# Patient Record
Sex: Female | Born: 1985 | Race: Black or African American | Hispanic: No | Marital: Single | Smoking: Never smoker
Health system: Southern US, Community
[De-identification: ages and names within clinical notes are randomized; demographics above are authoritative.]

## PROBLEM LIST (undated history)

## (undated) DIAGNOSIS — E0590 Thyrotoxicosis, unspecified without thyrotoxic crisis or storm: Secondary | ICD-10-CM

## (undated) DIAGNOSIS — E282 Polycystic ovarian syndrome: Secondary | ICD-10-CM

## (undated) DIAGNOSIS — E119 Type 2 diabetes mellitus without complications: Secondary | ICD-10-CM

---
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
## 2003-10-07 ENCOUNTER — Emergency Department (HOSPITAL_COMMUNITY): Admission: EM | Admit: 2003-10-07 | Discharge: 2003-10-08 | Payer: Self-pay | Admitting: Emergency Medicine

## 2004-03-12 ENCOUNTER — Emergency Department (HOSPITAL_COMMUNITY): Admission: EM | Admit: 2004-03-12 | Discharge: 2004-03-12 | Payer: Self-pay | Admitting: Emergency Medicine

## 2004-03-13 ENCOUNTER — Emergency Department (HOSPITAL_COMMUNITY): Admission: EM | Admit: 2004-03-13 | Discharge: 2004-03-13 | Payer: Self-pay | Admitting: Emergency Medicine

## 2004-07-09 ENCOUNTER — Other Ambulatory Visit: Admission: RE | Admit: 2004-07-09 | Discharge: 2004-07-09 | Payer: Self-pay | Admitting: Obstetrics and Gynecology

## 2004-10-04 ENCOUNTER — Emergency Department (HOSPITAL_COMMUNITY): Admission: EM | Admit: 2004-10-04 | Discharge: 2004-10-05 | Payer: Self-pay | Admitting: Emergency Medicine

## 2005-06-24 ENCOUNTER — Emergency Department (HOSPITAL_COMMUNITY): Admission: EM | Admit: 2005-06-24 | Discharge: 2005-06-24 | Payer: Self-pay | Admitting: Emergency Medicine

## 2005-11-23 ENCOUNTER — Emergency Department (HOSPITAL_COMMUNITY): Admission: EM | Admit: 2005-11-23 | Discharge: 2005-11-23 | Payer: Self-pay | Admitting: Emergency Medicine

## 2006-05-27 ENCOUNTER — Emergency Department (HOSPITAL_COMMUNITY): Admission: EM | Admit: 2006-05-27 | Discharge: 2006-05-27 | Payer: Self-pay | Admitting: Emergency Medicine

## 2007-05-17 ENCOUNTER — Emergency Department (HOSPITAL_COMMUNITY): Admission: EM | Admit: 2007-05-17 | Discharge: 2007-05-17 | Payer: Self-pay | Admitting: Emergency Medicine

## 2008-03-22 ENCOUNTER — Emergency Department (HOSPITAL_COMMUNITY): Admission: EM | Admit: 2008-03-22 | Discharge: 2008-03-22 | Payer: Self-pay | Admitting: Emergency Medicine

## 2009-01-23 ENCOUNTER — Emergency Department (HOSPITAL_COMMUNITY): Admission: EM | Admit: 2009-01-23 | Discharge: 2009-01-23 | Payer: Self-pay | Admitting: Emergency Medicine

## 2009-05-09 IMAGING — CT CT NECK W/ CM
3 of 4 series · 16 of 33 positions shown, 19 images · IV contrast (APPLIED)
Comparison: None

CLINICAL DATA: Neck pain.  Cervical adenopathy

CT NECK WITH CONTRAST
TECHNIQUE: Multidetector CT imaging of the neck was performed with
intravenous contrast.
Contrast: 100 ml 5mnipaque-4II IV

[Series 2: st neck 2.0 b31s · axial · 0.43mm/px · z∈[-372,-212]mm · 8 of 101 slices shown, 10 images]
[im 11/101  soft-tissue]
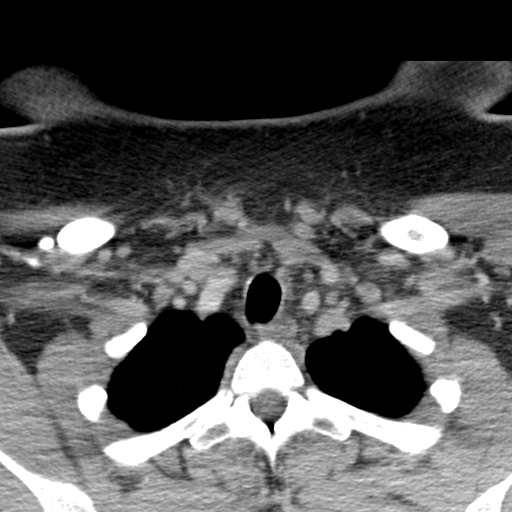
[im 11/101  bone]
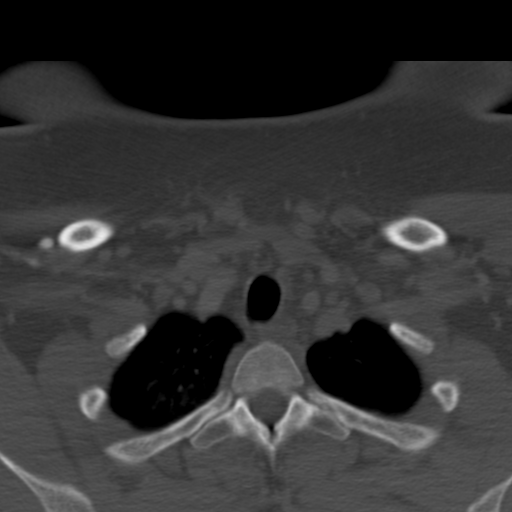
[im 21/101  bone]
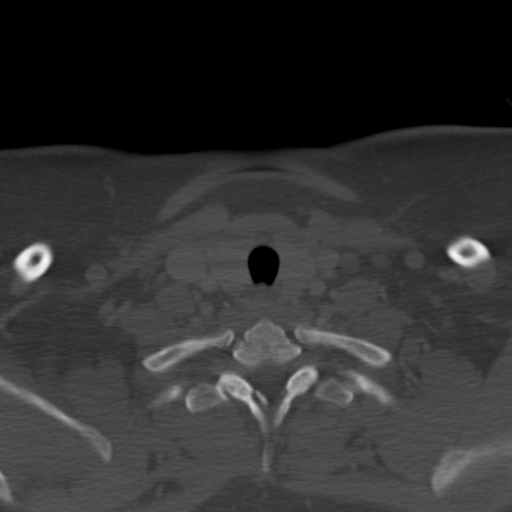
[im 31/101  bone]
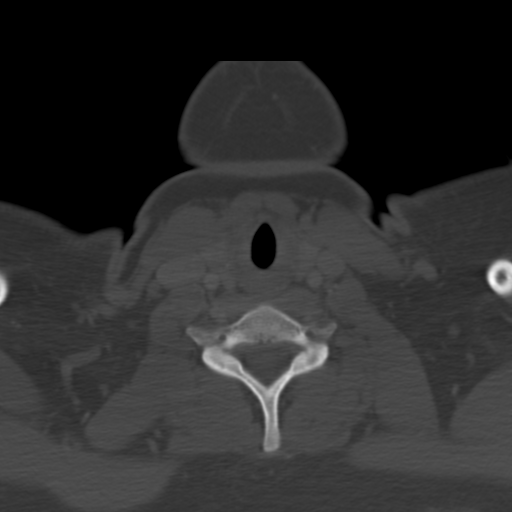
[im 41/101  bone]
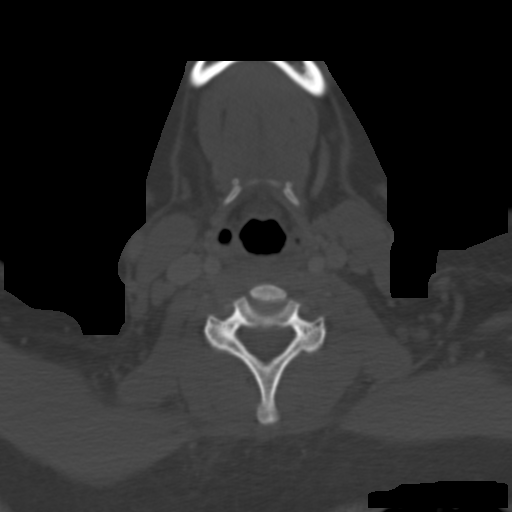
[im 61/101  soft-tissue]
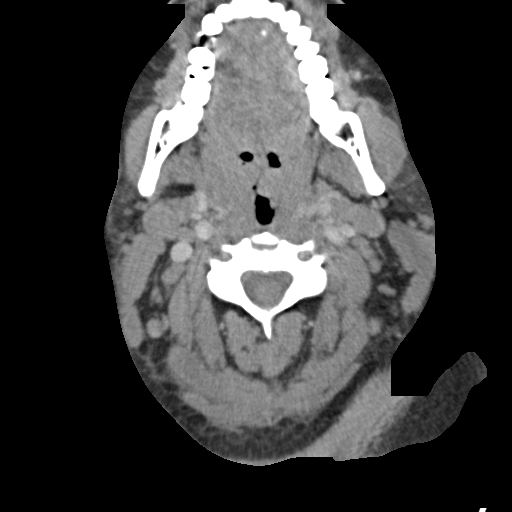
[im 61/101  bone]
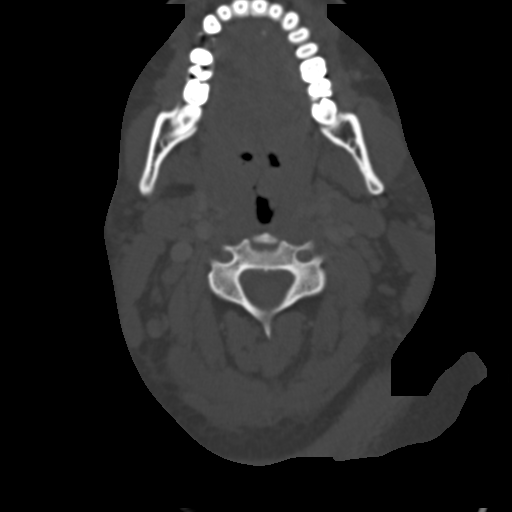
[im 71/101  bone]
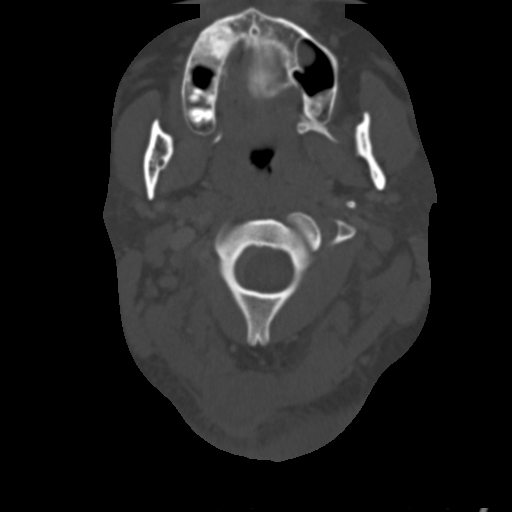
[im 81/101  bone]
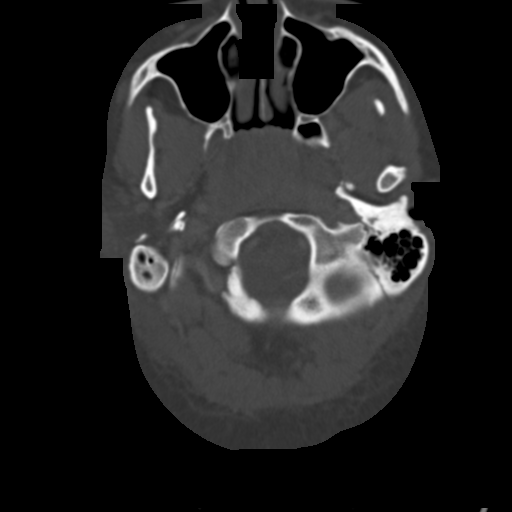
[im 91/101  bone]
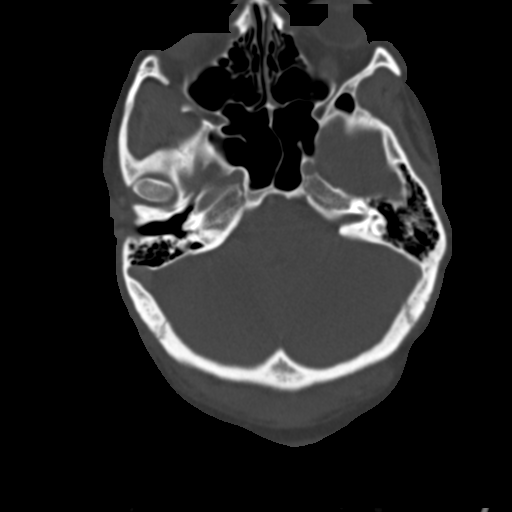

[Series 602: coronal neck · coronal · 0.43mm/px · 3 of 63 slices shown]
[im 13/63  bone]
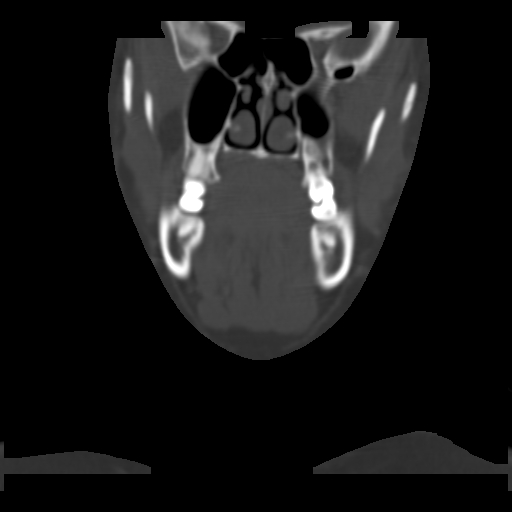
[im 25/63  bone]
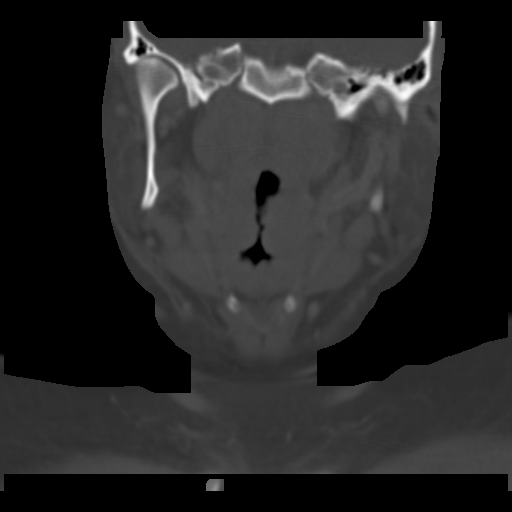
[im 38/63  bone]
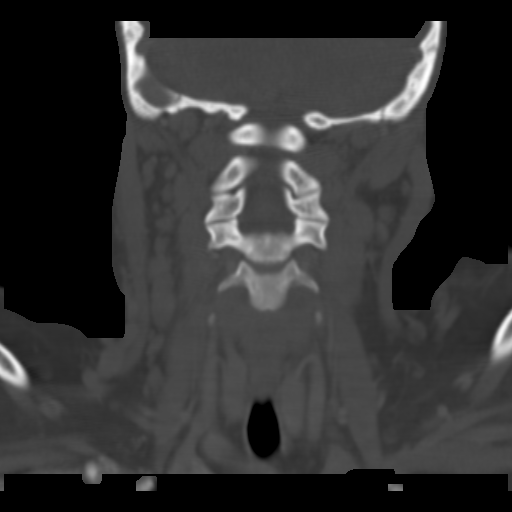

[Series 603: sagittal neck · sagittal · 0.43mm/px · 5 of 59 slices shown, 6 images]
[im 20/59  bone]
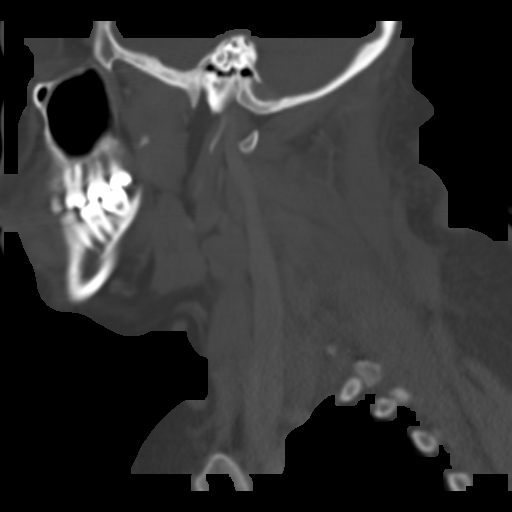
[im 25/59  bone]
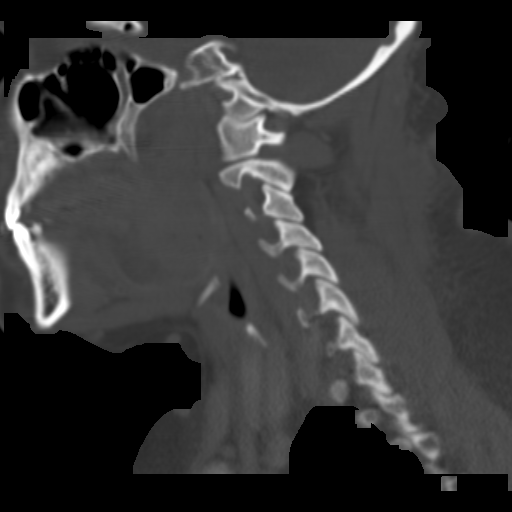
[im 30/59  soft-tissue]
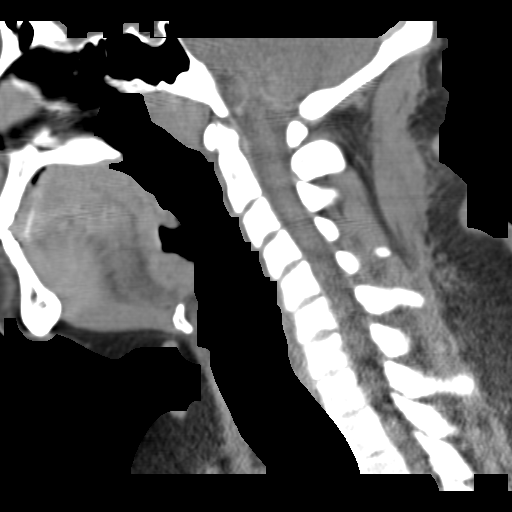
[im 30/59  bone]
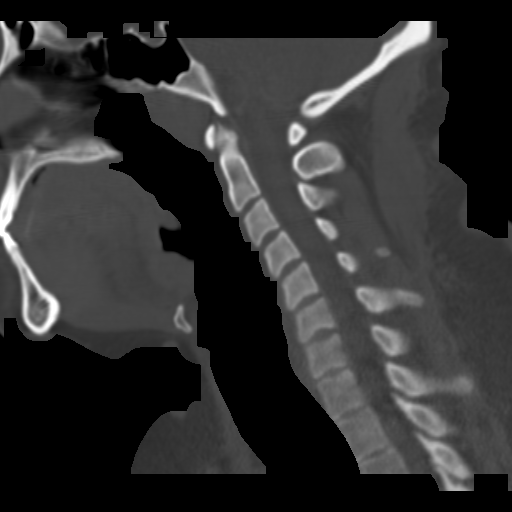
[im 34/59  bone]
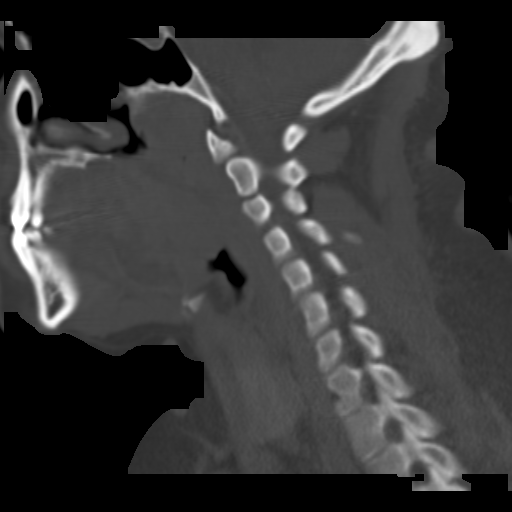
[im 39/59  bone]
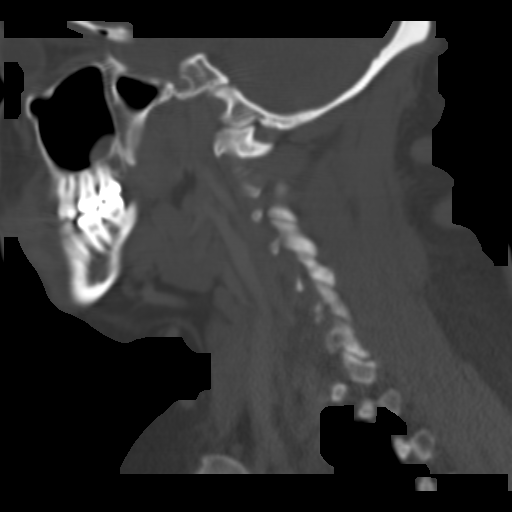

[16 of 33 positions shown; findings below may reference images not displayed]

FINDINGS: There is bilateral cervical lymphadenopathy.  The largest
nodes are level II nodes bilaterally, measuring 18 mm on the right
and 13 mm on the left.  Scattered sub centimeter level three nodes
are noted.  There are sub centimeter level five nodes posteriorly
bilaterally.  The nodes are all homogeneous and

 there is no necrosis of the nodes.

There is hypertrophy of the pharyngeal lymphoid tissue involving
the posterior nasopharynx and the tonsils.  This is symmetric and
homogeneous.  This may be due to hypertrophy due to infection or
possibly lymphoma.

The thyroid gland is normal.  The larynx appears normal.  The lung
apices are clear.  Mild mucosal thickening is present in the left
maxillary sinus the remainder of the sinuses are clear.
IMPRESSION: Mild to moderate cervical adenopathy bilaterally.  There is a
moderate amount of pharyngeal lymphoid  hypertrophy.  The findings
are most suggestive of reactive adenopathy related to pharyngitis.
Lymphoma is also a possibility.  Close clinical followup to
resolution of the adenopathy is suggested and biopsy may be
appropriate if the nodes do not resolve.

## 2009-12-05 ENCOUNTER — Emergency Department (HOSPITAL_COMMUNITY): Admission: EM | Admit: 2009-12-05 | Discharge: 2009-12-06 | Payer: Self-pay | Admitting: Emergency Medicine

## 2010-10-30 ENCOUNTER — Emergency Department (HOSPITAL_COMMUNITY): Admission: EM | Admit: 2010-10-30 | Discharge: 2010-08-11 | Payer: Self-pay | Admitting: Emergency Medicine

## 2011-02-08 LAB — URINALYSIS, ROUTINE W REFLEX MICROSCOPIC
Glucose, UA: NEGATIVE mg/dL
pH: 6 (ref 5.0–8.0)

## 2011-02-08 LAB — DIFFERENTIAL
Basophils Absolute: 0 10*3/uL (ref 0.0–0.1)
Basophils Relative: 1 % (ref 0–1)
Eosinophils Absolute: 0.1 10*3/uL (ref 0.0–0.7)
Eosinophils Relative: 2 % (ref 0–5)
Lymphocytes Relative: 34 % (ref 12–46)
Lymphs Abs: 1.2 10*3/uL (ref 0.7–4.0)
Monocytes Absolute: 0.6 10*3/uL (ref 0.1–1.0)
Monocytes Relative: 16 % — ABNORMAL HIGH (ref 3–12)
Neutro Abs: 1.7 10*3/uL (ref 1.7–7.7)
Neutrophils Relative %: 47 % (ref 43–77)

## 2011-02-08 LAB — COMPREHENSIVE METABOLIC PANEL
ALT: 21 U/L (ref 0–35)
AST: 26 U/L (ref 0–37)
Albumin: 4.1 g/dL (ref 3.5–5.2)
Alkaline Phosphatase: 89 U/L (ref 39–117)
BUN: 9 mg/dL (ref 6–23)
CO2: 27 mEq/L (ref 19–32)
Calcium: 9.2 mg/dL (ref 8.4–10.5)
Chloride: 104 mEq/L (ref 96–112)
Creatinine, Ser: 0.72 mg/dL (ref 0.4–1.2)
GFR calc Af Amer: >60 mL/min (ref >60)
GFR calc non Af Amer: >60 mL/min (ref >60)
Glucose, Bld: 112 mg/dL — ABNORMAL HIGH (ref 70–99)
Potassium: 4.4 mEq/L (ref 3.5–5.1)
Sodium: 138 mEq/L (ref 135–145)
Total Bilirubin: 0.7 mg/dL (ref 0.3–1.2)
Total Protein: 8.2 g/dL (ref 6.0–8.3)

## 2011-02-08 LAB — CBC
HCT: 38.2 % (ref 36.0–46.0)
Hemoglobin: 13.2 g/dL (ref 12.0–15.0)
MCHC: 34.6 g/dL (ref 30.0–36.0)
MCV: 85.2 fL (ref 78.0–100.0)
Platelets: 293 10*3/uL (ref 150–400)
RBC: 4.48 MIL/uL (ref 3.87–5.11)
RDW: 13.6 % (ref 11.5–15.5)
WBC: 3.6 10*3/uL — ABNORMAL LOW (ref 4.0–10.5)

## 2011-02-08 LAB — PREGNANCY, URINE: Preg Test, Ur: NEGATIVE

## 2011-03-05 LAB — DIFFERENTIAL
Basophils Relative: 2 % — ABNORMAL HIGH (ref 0–1)
Lymphocytes Relative: 35 % (ref 12–46)
Monocytes Relative: 8 % (ref 3–12)
Neutro Abs: 3 10*3/uL (ref 1.7–7.7)

## 2011-03-05 LAB — CBC
Hemoglobin: 12.8 g/dL (ref 12.0–15.0)
RBC: 4.47 MIL/uL (ref 3.87–5.11)
WBC: 5.8 10*3/uL (ref 4.0–10.5)

## 2011-04-06 ENCOUNTER — Emergency Department (HOSPITAL_COMMUNITY)
Admission: EM | Admit: 2011-04-06 | Discharge: 2011-04-06 | Disposition: A | Discharge status: Home / Self Care | Payer: Self-pay | Attending: Emergency Medicine | Admitting: Emergency Medicine

## 2011-04-06 DIAGNOSIS — N12 Tubulo-interstitial nephritis, not specified as acute or chronic: Secondary | ICD-10-CM | POA: Insufficient documentation

## 2011-04-06 DIAGNOSIS — R110 Nausea: Secondary | ICD-10-CM | POA: Insufficient documentation

## 2011-04-06 DIAGNOSIS — R10816 Epigastric abdominal tenderness: Secondary | ICD-10-CM | POA: Insufficient documentation

## 2011-04-06 DIAGNOSIS — R109 Unspecified abdominal pain: Secondary | ICD-10-CM | POA: Insufficient documentation

## 2011-04-06 LAB — POCT PREGNANCY, URINE: Preg Test, Ur: NEGATIVE

## 2011-04-06 LAB — URINALYSIS, ROUTINE W REFLEX MICROSCOPIC
Ketones, ur: NEGATIVE mg/dL
Nitrite: NEGATIVE
Specific Gravity, Urine: 1.022 (ref 1.005–1.030)
pH: 6 (ref 5.0–8.0)

## 2011-04-06 LAB — URINE MICROSCOPIC-ADD ON

## 2011-04-07 LAB — URINE CULTURE: Colony Count: 40000

## 2011-07-30 ENCOUNTER — Emergency Department (HOSPITAL_COMMUNITY): Payer: BC Managed Care – PPO

## 2011-07-30 ENCOUNTER — Emergency Department (HOSPITAL_COMMUNITY)
Admission: EM | Admit: 2011-07-30 | Discharge: 2011-07-30 | Disposition: A | Discharge status: Home / Self Care | Payer: BC Managed Care – PPO | Attending: Emergency Medicine | Admitting: Emergency Medicine

## 2011-07-30 DIAGNOSIS — R110 Nausea: Secondary | ICD-10-CM | POA: Insufficient documentation

## 2011-07-30 DIAGNOSIS — R109 Unspecified abdominal pain: Secondary | ICD-10-CM | POA: Insufficient documentation

## 2011-07-30 LAB — COMPREHENSIVE METABOLIC PANEL
BUN: 10 mg/dL (ref 6–23)
CO2: 24 mEq/L (ref 19–32)
Calcium: 8.8 mg/dL (ref 8.4–10.5)
GFR calc Af Amer: >60 mL/min (ref >60)
GFR calc non Af Amer: >60 mL/min (ref >60)
Glucose, Bld: 108 mg/dL — ABNORMAL HIGH (ref 70–99)
Total Protein: 7.6 g/dL (ref 6.0–8.3)

## 2011-07-30 LAB — CBC
HCT: 36.1 % (ref 36.0–46.0)
Hemoglobin: 12.1 g/dL (ref 12.0–15.0)
MCH: 28.7 pg (ref 26.0–34.0)
MCHC: 33.5 g/dL (ref 30.0–36.0)
MCV: 85.5 fL (ref 78.0–100.0)
RBC: 4.22 MIL/uL (ref 3.87–5.11)

## 2011-07-30 LAB — URINALYSIS, ROUTINE W REFLEX MICROSCOPIC
Bilirubin Urine: NEGATIVE
Ketones, ur: NEGATIVE mg/dL
Protein, ur: NEGATIVE mg/dL
Urobilinogen, UA: 0.2 mg/dL (ref 0.0–1.0)

## 2011-07-30 LAB — DIFFERENTIAL
Lymphocytes Relative: 17 % (ref 12–46)
Lymphs Abs: 1.6 10*3/uL (ref 0.7–4.0)
Monocytes Absolute: 0.6 10*3/uL (ref 0.1–1.0)
Monocytes Relative: 6 % (ref 3–12)
Neutro Abs: 7.2 10*3/uL (ref 1.7–7.7)

## 2011-07-30 LAB — LIPASE, BLOOD: Lipase: 35 U/L (ref 11–59)

## 2011-07-30 LAB — URINE MICROSCOPIC-ADD ON

## 2011-08-18 LAB — DIFFERENTIAL
Lymphocytes Relative: 36
Lymphs Abs: 2.8
Monocytes Absolute: 0.5
Monocytes Relative: 7
Neutro Abs: 4.4
Neutrophils Relative %: 55

## 2011-08-18 LAB — HEPATIC FUNCTION PANEL
ALT: 15
AST: 16
Bilirubin, Direct: <0.1
Total Bilirubin: 0.3

## 2011-08-18 LAB — URINALYSIS, ROUTINE W REFLEX MICROSCOPIC
Bilirubin Urine: NEGATIVE
Hgb urine dipstick: NEGATIVE
Nitrite: NEGATIVE
Protein, ur: NEGATIVE
Specific Gravity, Urine: 1.022
Urobilinogen, UA: 1

## 2011-08-18 LAB — URINE MICROSCOPIC-ADD ON

## 2011-08-18 LAB — CBC
Hemoglobin: 11.8 — ABNORMAL LOW
MCHC: 35.6
RBC: 3.91
WBC: 8

## 2011-08-18 LAB — POCT PREGNANCY, URINE: Preg Test, Ur: NEGATIVE

## 2011-08-18 LAB — POCT I-STAT, CHEM 8
BUN: 7
Calcium, Ion: 1.17
Chloride: 103
Creatinine, Ser: 0.9
Glucose, Bld: 99
HCT: 36
Potassium: 3.8

## 2019-10-21 ENCOUNTER — Encounter (HOSPITAL_COMMUNITY): Payer: Self-pay | Admitting: *Deleted

## 2019-10-21 ENCOUNTER — Emergency Department (HOSPITAL_COMMUNITY): Payer: No Typology Code available for payment source

## 2019-10-21 ENCOUNTER — Other Ambulatory Visit: Payer: Self-pay

## 2019-10-21 ENCOUNTER — Emergency Department (HOSPITAL_COMMUNITY)
Admission: EM | Admit: 2019-10-21 | Discharge: 2019-10-21 | Disposition: A | Discharge status: Home / Self Care | Payer: No Typology Code available for payment source | Attending: Emergency Medicine | Admitting: Emergency Medicine

## 2019-10-21 DIAGNOSIS — Z3A00 Weeks of gestation of pregnancy not specified: Secondary | ICD-10-CM | POA: Insufficient documentation

## 2019-10-21 DIAGNOSIS — O99891 Other specified diseases and conditions complicating pregnancy: Secondary | ICD-10-CM | POA: Insufficient documentation

## 2019-10-21 DIAGNOSIS — Y92410 Unspecified street and highway as the place of occurrence of the external cause: Secondary | ICD-10-CM | POA: Insufficient documentation

## 2019-10-21 DIAGNOSIS — Y999 Unspecified external cause status: Secondary | ICD-10-CM | POA: Diagnosis not present

## 2019-10-21 DIAGNOSIS — M79662 Pain in left lower leg: Secondary | ICD-10-CM | POA: Diagnosis not present

## 2019-10-21 DIAGNOSIS — Y93I9 Activity, other involving external motion: Secondary | ICD-10-CM | POA: Insufficient documentation

## 2019-10-21 DIAGNOSIS — M79605 Pain in left leg: Secondary | ICD-10-CM

## 2019-10-21 HISTORY — DX: Polycystic ovarian syndrome: E28.2

## 2019-10-21 MED ORDER — ACETAMINOPHEN 500 MG PO TABS
1000.0000 mg | ORAL_TABLET | Freq: Once | ORAL | Status: AC
  Administered 2019-10-21: 1000 mg via ORAL
  Filled 2019-10-21: qty 2
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
## 2021-11-12 ENCOUNTER — Emergency Department (HOSPITAL_COMMUNITY)
Admission: EM | Admit: 2021-11-12 | Discharge: 2021-11-12 | Disposition: A | Discharge status: Home / Self Care | Payer: BC Managed Care – PPO | Attending: Emergency Medicine | Admitting: Emergency Medicine

## 2021-11-12 DIAGNOSIS — Z20822 Contact with and (suspected) exposure to covid-19: Secondary | ICD-10-CM | POA: Insufficient documentation

## 2021-11-12 DIAGNOSIS — J069 Acute upper respiratory infection, unspecified: Secondary | ICD-10-CM

## 2021-11-12 DIAGNOSIS — R000 Tachycardia, unspecified: Secondary | ICD-10-CM | POA: Insufficient documentation

## 2021-11-12 LAB — RESP PANEL BY RT-PCR (FLU A&B, COVID) ARPGX2
Influenza A by PCR: NEGATIVE
Influenza B by PCR: NEGATIVE
SARS Coronavirus 2 by RT PCR: NEGATIVE

## 2021-11-12 MED ORDER — ALBUTEROL SULFATE HFA 108 (90 BASE) MCG/ACT IN AERS
2.0000 | INHALATION_SPRAY | RESPIRATORY_TRACT | Status: DC | PRN
  Filled 2021-11-12: qty 6.7

## 2021-11-12 MED ORDER — BENZONATATE 100 MG PO CAPS: 100.0000 mg | ORAL_CAPSULE | Freq: Two times a day (BID) | ORAL | 0 refills | Status: DC | PRN

## 2021-11-12 NOTE — ED Provider Notes (Signed)
[facility] EMERGENCY DEPARTMENT Provider Note   CSN: [id number] Arrival date & time: 11/12/21  0055     History Chief Complaint  Patient presents with   URI    Penny Tanner is a 35 y.o. female.  Patient presents to the emergency department with a chief complaint of cough and cold symptoms.  She states that she first got sick approximately 5 days ago.  She states that her daughter is sick with similar symptoms.  She states that she cannot smell or taste.  She reports associated congestion.  She states that her chest hurts when she coughs.  She has tried numerous over-the-counter cough and cold medications without relief.  The history is provided by the patient. No language interpreter was used.      Past Medical History:  Diagnosis Date   PCOS (polycystic ovarian syndrome)     There are no problems to display for this patient.   No past surgical history on file.   OB History   No obstetric history on file.     No family history on file.  Social History   Tobacco Use   Smoking status: Never   Smokeless tobacco: Never  Substance Use Topics   Alcohol use: Never   Drug use: Never    Home Medications Prior to Admission medications   Medication Sig Start Date End Date Taking? Authorizing Provider  benzonatate (TESSALON) 100 MG capsule Take 1 capsule (100 mg total) by mouth 2 (two) times daily as needed for cough. 11/12/21  Yes Roxy Horseman, PA-C    Allergies    Patient has no known allergies.  Review of Systems   Review of Systems  All other systems reviewed and are negative.  Physical Exam Updated Vital Signs BP (!) 157/87    Pulse (!) 119    Temp 98.3 F (36.8 C) (Oral)    Resp 18    SpO2 98%   Physical Exam Vitals and nursing note reviewed.  Constitutional:      General: She is not in acute distress.    Appearance: She is well-developed.  HENT:     Head: Normocephalic and atraumatic.     Nose: Congestion present.  Eyes:      Conjunctiva/sclera: Conjunctivae normal.  Cardiovascular:     Rate and Rhythm: Regular rhythm. Tachycardia present.     Heart sounds: No murmur heard. Pulmonary:     Effort: Pulmonary effort is normal. No respiratory distress.     Breath sounds: Normal breath sounds.  Abdominal:     Palpations: Abdomen is soft.     Tenderness: There is no abdominal tenderness.  Musculoskeletal:        General: No swelling.     Cervical back: Neck supple.  Skin:    General: Skin is warm and dry.     Capillary Refill: Capillary refill takes less than 2 seconds.  Neurological:     Mental Status: She is alert.  Psychiatric:        Mood and Affect: Mood normal.    ED Results / Procedures / Treatments   Labs (all labs ordered are listed, but only abnormal results are displayed) Labs Reviewed  RESP PANEL BY RT-PCR (FLU A&B, COVID) [id number]    EKG None  Radiology No results found.  Procedures Procedures   Medications Ordered in ED Medications  albuterol (VENTOLIN HFA) 108 (90 Base) MCG/ACT inhaler 2 puff (has no administration in time range)    ED Course  I  have reviewed the triage vital signs and the nursing notes.  Pertinent labs & imaging results that were available during my care of the patient were reviewed by me and considered in my medical decision making (see chart for details).    MDM Rules/Calculators/A&P                         Pt CXR negative for acute infiltrate. Patients symptoms are consistent with URI, likely viral etiology. Discussed that antibiotics are not indicated for viral infections. Pt will be discharged with symptomatic treatment.  Verbalizes understanding and is agreeable with plan. Pt is hemodynamically stable & in NAD prior to dc.     Final Clinical Impression(s) / ED Diagnoses Final diagnoses:  Viral URI with cough    Rx / DC Orders ED Discharge Orders          Ordered    benzonatate (TESSALON) 100 MG capsule  2 times daily PRN        11/12/21  0108             Roxy Horseman, PA-C 11/12/21 0110    Melene Plan, DO 11/12/21 3299

## 2021-11-12 NOTE — Discharge Instructions (Addendum)
Please monitor my chart for the COVID and flu results.  They should be back in a few hours.  Use the inhaler 2 puffs, every 4 hours as needed.  I have sent cough medicine to your pharmacy.

## 2021-11-12 NOTE — ED Triage Notes (Signed)
Pt c/o congestion/sinus pressure, cough w associated CP, loss of smell/taste since Saturday. States 37yo daughter is sick. Vaccinated for covid, has not had flu shot.

## 2022-02-04 ENCOUNTER — Inpatient Hospital Stay (HOSPITAL_COMMUNITY)
Admission: EM | Admit: 2022-02-04 | Discharge: 2022-02-07 | DRG: 644 | Disposition: A | Discharge status: Home / Self Care | Payer: BC Managed Care – PPO | Attending: Internal Medicine | Admitting: Internal Medicine

## 2022-02-04 ENCOUNTER — Encounter (HOSPITAL_COMMUNITY): Payer: Self-pay

## 2022-02-04 ENCOUNTER — Other Ambulatory Visit: Payer: Self-pay

## 2022-02-04 ENCOUNTER — Emergency Department (HOSPITAL_COMMUNITY): Payer: BC Managed Care – PPO

## 2022-02-04 DIAGNOSIS — E872 Acidosis, unspecified: Secondary | ICD-10-CM | POA: Diagnosis present

## 2022-02-04 DIAGNOSIS — N611 Abscess of the breast and nipple: Secondary | ICD-10-CM | POA: Diagnosis present

## 2022-02-04 DIAGNOSIS — E1165 Type 2 diabetes mellitus with hyperglycemia: Secondary | ICD-10-CM | POA: Diagnosis present

## 2022-02-04 DIAGNOSIS — E0590 Thyrotoxicosis, unspecified without thyrotoxic crisis or storm: Secondary | ICD-10-CM | POA: Diagnosis not present

## 2022-02-04 DIAGNOSIS — E282 Polycystic ovarian syndrome: Secondary | ICD-10-CM | POA: Diagnosis present

## 2022-02-04 DIAGNOSIS — E871 Hypo-osmolality and hyponatremia: Secondary | ICD-10-CM | POA: Diagnosis present

## 2022-02-04 DIAGNOSIS — I959 Hypotension, unspecified: Secondary | ICD-10-CM | POA: Diagnosis present

## 2022-02-04 DIAGNOSIS — Z6836 Body mass index (BMI) 36.0-36.9, adult: Secondary | ICD-10-CM

## 2022-02-04 DIAGNOSIS — L732 Hidradenitis suppurativa: Secondary | ICD-10-CM | POA: Diagnosis present

## 2022-02-04 DIAGNOSIS — Z9114 Patient's other noncompliance with medication regimen: Secondary | ICD-10-CM

## 2022-02-04 DIAGNOSIS — E669 Obesity, unspecified: Secondary | ICD-10-CM

## 2022-02-04 DIAGNOSIS — Z20822 Contact with and (suspected) exposure to covid-19: Secondary | ICD-10-CM | POA: Diagnosis present

## 2022-02-04 HISTORY — DX: Thyrotoxicosis, unspecified without thyrotoxic crisis or storm: E05.90

## 2022-02-04 HISTORY — DX: Type 2 diabetes mellitus without complications: E11.9

## 2022-02-04 LAB — COMPREHENSIVE METABOLIC PANEL
ALT: 22 U/L (ref 0–44)
AST: 36 U/L (ref 15–41)
Albumin: 3.6 g/dL (ref 3.5–5.0)
Alkaline Phosphatase: 157 U/L — ABNORMAL HIGH (ref 38–126)
Anion gap: 11 (ref 5–15)
BUN: 6 mg/dL (ref 6–20)
CO2: 21 mmol/L — ABNORMAL LOW (ref 22–32)
Calcium: 9.3 mg/dL (ref 8.9–10.3)
Chloride: 98 mmol/L (ref 98–111)
Creatinine, Ser: 0.61 mg/dL (ref 0.44–1.00)
GFR, Estimated: >60 mL/min (ref >60)
Glucose, Bld: 312 mg/dL — ABNORMAL HIGH (ref 70–99)
Potassium: 4.2 mmol/L (ref 3.5–5.1)
Sodium: 130 mmol/L — ABNORMAL LOW (ref 135–145)
Total Bilirubin: 0.4 mg/dL (ref 0.3–1.2)
Total Protein: 7.9 g/dL (ref 6.5–8.1)

## 2022-02-04 LAB — MRSA NEXT GEN BY PCR, NASAL: MRSA by PCR Next Gen: NOT DETECTED

## 2022-02-04 LAB — CBC WITH DIFFERENTIAL/PLATELET
Abs Immature Granulocytes: 0.04 10*3/uL (ref 0.00–0.07)
Basophils Absolute: 0 10*3/uL (ref 0.0–0.1)
Basophils Relative: 0 %
Eosinophils Absolute: 0.1 10*3/uL (ref 0.0–0.5)
Eosinophils Relative: 1 %
HCT: 41.9 % (ref 36.0–46.0)
Hemoglobin: 13.2 g/dL (ref 12.0–15.0)
Immature Granulocytes: 1 %
Lymphocytes Relative: 12 %
Lymphs Abs: 0.7 10*3/uL (ref 0.7–4.0)
MCH: 25.7 pg — ABNORMAL LOW (ref 26.0–34.0)
MCHC: 31.5 g/dL (ref 30.0–36.0)
MCV: 81.5 fL (ref 80.0–100.0)
Monocytes Absolute: 0.2 10*3/uL (ref 0.1–1.0)
Monocytes Relative: 3 %
Neutro Abs: 4.6 10*3/uL (ref 1.7–7.7)
Neutrophils Relative %: 83 %
Platelets: 309 10*3/uL (ref 150–400)
RBC: 5.14 MIL/uL — ABNORMAL HIGH (ref 3.87–5.11)
RDW: 12.4 % (ref 11.5–15.5)
WBC: 5.6 10*3/uL (ref 4.0–10.5)
nRBC: 0 % (ref 0.0–0.2)

## 2022-02-04 LAB — LACTIC ACID, PLASMA
Lactic Acid, Venous: 3.5 mmol/L (ref 0.5–1.9)
Lactic Acid, Venous: 3.6 mmol/L (ref 0.5–1.9)
Lactic Acid, Venous: 2.3 mmol/L (ref 0.5–1.9)

## 2022-02-04 LAB — TSH: TSH: <0.01 u[IU]/mL — ABNORMAL LOW (ref 0.350–4.500)

## 2022-02-04 LAB — I-STAT BETA HCG BLOOD, ED (MC, WL, AP ONLY): I-stat hCG, quantitative: <5 m[IU]/mL (ref <5)

## 2022-02-04 LAB — RESP PANEL BY RT-PCR (FLU A&B, COVID) ARPGX2
Influenza A by PCR: NEGATIVE
Influenza B by PCR: NEGATIVE
SARS Coronavirus 2 by RT PCR: NEGATIVE

## 2022-02-04 LAB — URINALYSIS, ROUTINE W REFLEX MICROSCOPIC
Bilirubin Urine: NEGATIVE
Glucose, UA: >=500 mg/dL — AB
Hgb urine dipstick: NEGATIVE
Ketones, ur: NEGATIVE mg/dL
Leukocytes,Ua: NEGATIVE
Nitrite: NEGATIVE
Protein, ur: NEGATIVE mg/dL
Specific Gravity, Urine: 1.021 (ref 1.005–1.030)
pH: 5 (ref 5.0–8.0)

## 2022-02-04 LAB — CK: Total CK: 56 U/L (ref 38–234)

## 2022-02-04 LAB — APTT: aPTT: 25 seconds (ref 24–36)

## 2022-02-04 LAB — PROTIME-INR
INR: 1.1 (ref 0.8–1.2)
Prothrombin Time: 13.7 seconds (ref 11.4–15.2)

## 2022-02-04 LAB — GAMMA GT: GGT: 26 U/L (ref 7–50)

## 2022-02-04 LAB — TROPONIN I (HIGH SENSITIVITY)
Troponin I (High Sensitivity): 5 ng/L (ref <18)
Troponin I (High Sensitivity): 5 ng/L (ref <18)

## 2022-02-04 LAB — HIV ANTIBODY (ROUTINE TESTING W REFLEX): HIV Screen 4th Generation wRfx: NONREACTIVE

## 2022-02-04 LAB — CBG MONITORING, ED: Glucose-Capillary: 214 mg/dL — ABNORMAL HIGH (ref 70–99)

## 2022-02-04 LAB — PHOSPHORUS: Phosphorus: 2.5 mg/dL (ref 2.5–4.6)

## 2022-02-04 LAB — T4, FREE: Free T4: 2.32 ng/dL — ABNORMAL HIGH (ref 0.61–1.12)

## 2022-02-04 LAB — MAGNESIUM: Magnesium: 1.3 mg/dL — ABNORMAL LOW (ref 1.7–2.4)

## 2022-02-04 MED ORDER — LACTATED RINGERS IV BOLUS: 1000.0000 mL | Freq: Once | INTRAVENOUS | Status: DC

## 2022-02-04 MED ORDER — METHIMAZOLE 10 MG PO TABS
20.0000 mg | ORAL_TABLET | ORAL | Status: DC
  Administered 2022-02-05 (×4): 20 mg via ORAL
  Filled 2022-02-04 (×4): qty 2

## 2022-02-04 MED ORDER — ACETAMINOPHEN 325 MG PO TABS
650.0000 mg | ORAL_TABLET | Freq: Four times a day (QID) | ORAL | Status: DC | PRN
  Administered 2022-02-04 – 2022-02-06 (×5): 650 mg via ORAL
  Filled 2022-02-04 (×5): qty 2

## 2022-02-04 MED ORDER — LACTATED RINGERS IV BOLUS
1000.0000 mL | Freq: Once | INTRAVENOUS | Status: AC
  Administered 2022-02-04: 1000 mL via INTRAVENOUS

## 2022-02-04 MED ORDER — ACETAMINOPHEN 500 MG PO TABS
1000.0000 mg | ORAL_TABLET | Freq: Once | ORAL | Status: AC
Start: 2022-02-04 — End: 2022-02-04
  Administered 2022-02-04: 1000 mg via ORAL
  Filled 2022-02-04: qty 2

## 2022-02-04 MED ORDER — HYDROCODONE-ACETAMINOPHEN 5-325 MG PO TABS
1.0000 | ORAL_TABLET | Freq: Four times a day (QID) | ORAL | Status: DC | PRN
  Administered 2022-02-04 – 2022-02-06 (×5): 1 via ORAL
  Filled 2022-02-04 (×6): qty 1

## 2022-02-04 MED ORDER — ONDANSETRON HCL 4 MG PO TABS: 4.0000 mg | ORAL_TABLET | Freq: Four times a day (QID) | ORAL | Status: DC | PRN

## 2022-02-04 MED ORDER — SENNOSIDES-DOCUSATE SODIUM 8.6-50 MG PO TABS: 1.0000 | ORAL_TABLET | Freq: Every evening | ORAL | Status: DC | PRN

## 2022-02-04 MED ORDER — INSULIN ASPART 100 UNIT/ML IJ SOLN
5.0000 [IU] | Freq: Once | INTRAMUSCULAR | Status: AC
  Administered 2022-02-04: 5 [IU] via SUBCUTANEOUS

## 2022-02-04 MED ORDER — FENTANYL CITRATE PF 50 MCG/ML IJ SOSY
50.0000 ug | PREFILLED_SYRINGE | Freq: Once | INTRAMUSCULAR | Status: AC
  Administered 2022-02-04: 50 ug via INTRAVENOUS
  Filled 2022-02-04: qty 1

## 2022-02-04 MED ORDER — ENOXAPARIN SODIUM 40 MG/0.4ML IJ SOSY
40.0000 mg | PREFILLED_SYRINGE | INTRAMUSCULAR | Status: DC
  Administered 2022-02-04 – 2022-02-06 (×3): 40 mg via SUBCUTANEOUS
  Filled 2022-02-04 (×3): qty 0.4

## 2022-02-04 MED ORDER — ONDANSETRON HCL 4 MG/2ML IJ SOLN
4.0000 mg | Freq: Once | INTRAMUSCULAR | Status: AC
  Administered 2022-02-04: 4 mg via INTRAVENOUS
  Filled 2022-02-04: qty 2

## 2022-02-04 MED ORDER — MAGNESIUM SULFATE 2 GM/50ML IV SOLN
2.0000 g | Freq: Once | INTRAVENOUS | Status: AC
  Administered 2022-02-04: 2 g via INTRAVENOUS
  Filled 2022-02-04: qty 50

## 2022-02-04 MED ORDER — INSULIN DETEMIR 100 UNIT/ML ~~LOC~~ SOLN
10.0000 [IU] | Freq: Every day | SUBCUTANEOUS | Status: DC
  Administered 2022-02-04 – 2022-02-05 (×2): 10 [IU] via SUBCUTANEOUS
  Filled 2022-02-04 (×3): qty 0.1

## 2022-02-04 MED ORDER — ACETAMINOPHEN 650 MG RE SUPP: 650.0000 mg | Freq: Four times a day (QID) | RECTAL | Status: DC | PRN

## 2022-02-04 MED ORDER — ONDANSETRON HCL 4 MG/2ML IJ SOLN: 4.0000 mg | Freq: Four times a day (QID) | INTRAMUSCULAR | Status: DC | PRN

## 2022-02-04 MED ORDER — SODIUM CHLORIDE 0.9 % IV BOLUS
1000.0000 mL | Freq: Once | INTRAVENOUS | Status: AC
  Administered 2022-02-04: 1000 mL via INTRAVENOUS

## 2022-02-04 MED ORDER — INSULIN ASPART 100 UNIT/ML IJ SOLN
0.0000 [IU] | INTRAMUSCULAR | Status: DC
  Administered 2022-02-05: 3 [IU] via SUBCUTANEOUS
  Administered 2022-02-05 (×2): 8 [IU] via SUBCUTANEOUS
  Administered 2022-02-05 (×2): 5 [IU] via SUBCUTANEOUS
  Administered 2022-02-05 – 2022-02-06 (×2): 3 [IU] via SUBCUTANEOUS
  Administered 2022-02-06 (×2): 2 [IU] via SUBCUTANEOUS
  Administered 2022-02-06: 5 [IU] via SUBCUTANEOUS
  Administered 2022-02-06 (×2): 2 [IU] via SUBCUTANEOUS
  Administered 2022-02-07: 3 [IU] via SUBCUTANEOUS
  Administered 2022-02-07: 5 [IU] via SUBCUTANEOUS
  Administered 2022-02-07 (×2): 3 [IU] via SUBCUTANEOUS

## 2022-02-04 MED ORDER — SODIUM CHLORIDE 0.9 % IV SOLN
2.0000 g | Freq: Once | INTRAVENOUS | Status: AC
  Administered 2022-02-04: 2 g via INTRAVENOUS
  Filled 2022-02-04: qty 20

## 2022-02-04 MED ORDER — PROPRANOLOL HCL 60 MG PO TABS
60.0000 mg | ORAL_TABLET | ORAL | Status: DC
  Administered 2022-02-05 (×4): 60 mg via ORAL
  Filled 2022-02-04 (×4): qty 1

## 2022-02-04 MED ORDER — SODIUM CHLORIDE 0.9 % IV SOLN: INTRAVENOUS | Status: DC

## 2022-02-04 NOTE — ED Notes (Signed)
Delay in triage d/t influx of pts ?

## 2022-02-04 NOTE — ED Notes (Signed)
ED Provider at bedside. 

## 2022-02-04 NOTE — H&P (Addendum)
? ? ? ?Date: 02/04/2022     ?     ?     ?Patient Name:  Penny Tanner MRN: [id number]  ?DOB: [birth year] Age / Sex: 36 y.o., female   ?PCP: Pcp, No    ?     ?Medical Service: Internal Medicine Teaching Service    ?     ?Attending Physician: Dr. Mikey Bussing, Marthenia Rolling, DO    ?First Contact: Dr. Champ Mungo Pager: [phone]  ?Second Contact: Dr. Sharrell Ku Pager: [phone]  ?     ?After Hours (After 5p/  First Contact Pager: [phone]  ?weekends / holidays): Second Contact Pager: [phone]  ? ?Chief Complaint: Body aches, malaise ? ?History of Present Illness: Penny Tanner is a 36 year old female with past medical history of untreated hypothyroidism, hidradenitis suppurativa, recently diagnosed type 2 diabetes mellitus who presents after sudden onset of generalized malaise, severe body aches, and chills at work today.  She says that she was feeling well until today.  She endorses generalized weakness, headache, nausea with inability to vomit.  She says that her body aches are diffuse and throbbing in nature.  Her legs and back are most bothersome and she endorses some numbness and tingling in these areas as well though no specific location can be identified. ? ?She states that she was recently started on Bactrim for right breast and nipple abscesses which she has been taking as prescribed.  She endorses chills, fever, nausea, myalgias, headache, polydipsia, polyuria, numbness and tingling of legs and back with pain.  No diarrhea, constipation, abdominal pain, chest pain, shortness of breath, recent viral illnesses, sick contacts, dysuria.  No recent travel, new tattoos, new piercings, recent falls with abrasions. ? ?ED course: Hemodynamically stable without fever, CBC without leukocytosis, CMP showing hyponatremia and hyperglycemia, when corrected sodium is 134.  Urinalysis showed budding yeast but not overall mainly concerning for bacterial infection, glucose greater than 500.  Initial lactic acid of 3.6, minimally  improved to 3.5 with 1L LR.  Given 1 dose ceftriaxone 2 g IV. ? ?Meds: None ? ?Allergies: ?Allergies as of 02/04/2022  ? (No Known Allergies)  ? ?Past Medical History:  ?Diagnosis Date  ? PCOS (polycystic ovarian syndrome)   ? ? ?Family History: Unknown maternal family history, paternal family history is widespread without further detail provided. ? ?Social History: Patient has a nearly 55-year-old child.  She works at [employer] and is independent in [employer].  She does not have a primary care provider she recently relocated to the area.  Denies tobacco or illicit substance use and drinks alcohol socially. ? ?Review of Systems: ?A complete ROS was negative except as per HPI.  ? ?Physical Exam: ?Blood pressure (!) 115/56, pulse (!) 131, temperature 99.7 ?F (37.6 ?C), temperature source Oral, resp. rate (!) 35, height 5\' 11"  (1.803 m), weight 118 kg, SpO2 91 %. ?Constitutional: Tearful, ill-appearing female in obvious pain.  Shivering and moaning throughout the entirety of interview and physical exam. ?Eyes: Mild bilateral conjunctival injection. ?Neck: No thyromegaly. ?Cardio: Tachycardia and regular rhythm.  No murmurs, rubs, gallops. ?Pulm: Clear to auscultation bilaterally. ?Abdomen: Soft, nontender, nondistended.  Negative Murphy sign. ?MSK: Negative for extremity edema. ?Skin: Skin is hot to touch and dry.  Patient has a firm, nondraining lesion at the 6:00 aspect of the right breast with erythema and tenderness to palpation.  There is an additional lesion, also with increased erythema and tenderness to palpation directly above the umbilicus.  Neither of these are  actively draining.  Bilateral axillary what appears to be nondraining hidradenitis suppurativa, tender to palpation. ?Neuro: Alert and oriented x3.  No focal deficit noted. ?Psych: Tearful. ? ?EKG: personally reviewed my interpretation is sinus tachycardia with possible U waves. ? ?CXR: personally reviewed my interpretation is no acute  cardiopulmonary abnormalities. ? ?Assessment & Plan by Problem: ?Principal Problem: ?  Severe hyperglycemia due to diabetes mellitus (HCC) ? ?SIRS positive ?Presentation is perplexing and that symptom onset was sudden while she was at work today with 0 symptoms preceding.  Patient is afebrile with normotension, tachycardia, tachypnea, and she is hot to the touch.  She has lactic acidosis of 3.6, minimally improved to 3.5 with 1L LR.  No leukocytosis.  Urinalysis showed budding yeast but otherwise not remarkable for bacterial infection.  Urine culture and blood cultures are pending at this time.  S/P ceftriaxone 2 g IV in the ED.  She is nauseous with gagging nonproductive of actual emesis.  She has two abscess sites, one located at the 6 o'clock position of the right breast and another midline directly above her umbilicus.  Neither of these are actively draining but she does say that they have been draining recently, and the area above her umbilicus has a small possibly open area.  She denies diarrhea, constipation, recent illnesses, sick contacts.  At this time differential is quite broad given objective findings and patient's description of symptoms and includes viral illness, bacteremia from incompletely treated abscesses, meningitis, myocardial ischemia, adverse effects of untreated thyroid disease (see below).  ?-Follow-up  ?-Urine culture ?-Blood culture ?-Magnesium, phosphorus ?-Lactic acid ?-CK ?-UDS ?-RPR ?-GGT ?-Troponins ?-Trend CBC, fever curve ?-Continue ceftriaxone, start vancomycin ? ?Type 2 diabetes mellitus ?Patient states that she was diagnosed with type 2 diabetes 2 weeks ago but is not currently on any medication or insulin for her diabetes.  Admission labs revealed blood glucose of 312, urinalysis with glucose >500 and budding yeast.  At this time her bicarbonate is low at 21, no anion gap.  She endorses polyuria and polydipsia but denies dysuria or lower abdominal pain. Will need close  monitoring as acute inflammatory status could easily tip her into DKA. ?-Follow-up HbA1c ?-NovoLog 5 units x 1 ?-Levemir 10 units daily at bedtime ?-SSI ? ?Hypothyroidism, untreated ?Patient states that she was diagnosed with hypothyroidism last summer and was treated with thyroid medication, unknown name or dose, for a few months but since relocating to the area has not been taking medication. I am concerned that untreated thyroid illness could be contributing to overall presentation. ?-Stat TSH, T3, T4 ? ?Dispo: Admit patient to Observation with expected length of stay less than 2 midnights. ? ?Signed: ?Champ Mungo, DO ?02/04/2022, 6:38 PM  ?Pager: [phone] ?After 5pm on weekdays and 1pm on weekends: On Call pager: [phone] ? ?

## 2022-02-04 NOTE — ED Triage Notes (Signed)
Pt arrives POV for eval of chills, body aches onset at noon today. Reports some associated nausea. Denies known illness ? ?

## 2022-02-04 NOTE — Consult Note (Signed)
? ?NAME:  Penny Tanner, MRN:  [id number], DOB:  12-06-1985, LOS: 0 ?ADMISSION DATE:  02/04/2022, CONSULTATION DATE:  02/04/2022 ?REFERRING MD:  Gust Rung, DO, CHIEF COMPLAINT:  body aches  ? ?History of Present Illness:  ?Penny Tanner is a 36 y.o. woman with past medical history of hypothyroidism who presents for worsening malaise, fevers, body aches.  She is originally from [place] and moved to [place] last year.  She was taking antithyroid medication, cannot recall the name, but since she moved to [place] she has not established with a endocrinologist.  She has not been on any thyroid medication since August 2022.  She is also been told that she has type 2 diabetes but is not currently on any medications for this.  On arrival to  the ED she was evaluated and admitted by the internal medicine teaching service.  She was found to have a TSH that was undetectably low with free T4 that was 2 times the upper limit of normal.  Please exam is being consulted to evaluate the patient in the setting of acute thyrotoxicosis. ? ?Pertinent  Medical History  ?Type 2 DM ?Hyperthyroidism ?PCOS ? ? ?Significant Hospital Events: ?Including procedures, antibiotic start and stop dates in addition to other pertinent events   ?3/15 presented to ED with body aches, feeling unwell ? ?Interim History / Subjective:  ? ? ?Objective   ?Blood pressure 100/76, pulse (!) 135, temperature (!) 103 ?F (39.4 ?C), temperature source Oral, resp. rate (!) 21, height 5\' 11"  (1.803 m), weight 118 kg, SpO2 96 %. ?   ?   ?No intake or output data in the 24 hours ending 02/04/22 2222 ?Filed Weights  ? 02/04/22 1409  ?Weight: 118 kg  ? ? ?Examination: ?General: fatigued ?HENT: mmm ?Lungs: ctab no wheezes no increased work of breathing ?Cardiovascular: tachycardic, regular ?Abdomen: soft, nontender ?Extremities: warm to touch, no edema ?Neuro: alert, oriented, no focal asymmetry, normal speech ? ?Resolved Hospital Problem list    ? ? ?Assessment & Plan:  ?Acute thyrotoxicosis, likely secondary to medication nonadherence ?She has tachycardia, pyrexia, restlessness. ?We will initiate propanolol every 4 hours with methimazole.  She already has been endocrinology appointment scheduled in a couple of weeks. ?No urgent need for ICU.  Agree with continuing telemetry monitoring and progressive care bed.  ? ?PCCM team will see as needed at this point.  Please call if there is a clinical change in the patient or if we can be of further assistance. ? ?02/06/22, MD ?Pulmonary and Critical Care Medicine ?[facility] ?02/04/2022 10:36 PM ?Pager: see AMION ? ?If no response to pager, please call critical care on call (see AMION) until 7pm ?After 7:00 pm call Elink   ? ? ?Labs   ?CBC: ?Recent Labs  ?Lab 02/04/22 ?1444  ?WBC 5.6  ?NEUTROABS 4.6  ?HGB 13.2  ?HCT 41.9  ?MCV 81.5  ?PLT 309  ? ? ?Basic Metabolic Panel: ?Recent Labs  ?Lab 02/04/22 ?1444 02/04/22 ?1848  ?NA 130*  --   ?K 4.2  --   ?CL 98  --   ?CO2 21*  --   ?GLUCOSE 312*  --   ?BUN 6  --   ?CREATININE 0.61  --   ?CALCIUM 9.3  --   ?MG  --  1.3*  ?PHOS  --  2.5  ? ?GFR: ?Estimated Creatinine Clearance: 136.3 mL/min (by C-G formula based on SCr of 0.61 mg/dL). ?Recent Labs  ?Lab 02/04/22 ?1444 02/04/22 ?1527  02/04/22 ?1944  ?WBC 5.6  --   --   ?LATICACIDVEN 3.6* 3.5* 2.3*  ? ? ?Liver Function Tests: ?Recent Labs  ?Lab 02/04/22 ?1444  ?AST 36  ?ALT 22  ?ALKPHOS 157*  ?BILITOT 0.4  ?PROT 7.9  ?ALBUMIN 3.6  ? ?No results for input(s): LIPASE, AMYLASE in the last 168 hours. ?No results for input(s): AMMONIA in the last 168 hours. ? ?ABG ?   ?Component Value Date/Time  ? TCO2 27 03/22/2008 0242  ?  ? ?Coagulation Profile: ?Recent Labs  ?Lab 02/04/22 ?1444  ?INR 1.1  ? ? ?Cardiac Enzymes: ?Recent Labs  ?Lab 02/04/22 ?1848  ?CKTOTAL 56  ? ? ?HbA1C: ?No results found for: HGBA1C ? ?CBG: ?Recent Labs  ?Lab 02/04/22 ?2004  ?GLUCAP 214*  ? ? ?Review of Systems:   ?+nausea without vomiting ?+Body  aches ?+Palpitations ?+Fevers malaise ?No cough, shortness of breath ?No dysuria, diarrhea, cough, shortness of breath sinus pressure or congestion ? ?Past Medical History:  ?She,  has a past medical history of PCOS (polycystic ovarian syndrome).  ? ?Surgical History:  ?History reviewed. No pertinent surgical history.  ? ?Social History:  ? reports that she has never smoked. She has never used smokeless tobacco. She reports that she does not drink alcohol and does not use drugs.  ? ?Family History:  ?Her family history is not on file.  ? ?Allergies ?No Known Allergies  ? ?Home Medications  ?Prior to Admission medications   ?Medication Sig Start Date End Date Taking? Authorizing Provider  ?sulfamethoxazole-trimethoprim (BACTRIM DS) 800-160 MG tablet Take 1 tablet by mouth every 12 (twelve) hours as needed. 01/22/22  Yes [provider]  ?benzonatate (TESSALON) 100 MG capsule Take 1 capsule (100 mg total) by mouth 2 (two) times daily as needed for cough. ?Patient not taking: Reported on 02/04/2022 11/12/21   Roxy Horseman, PA-C  ?  ? ? ? ? ? ?

## 2022-02-04 NOTE — ED Provider Notes (Signed)
Sign out note ? ?36 y/o lady with newly diagnosed T2DM but not on metformin yet presenting for generalized myalgias, chills, nausea.  On exam she appears well in no distress.  Nontoxic.  Does have small area of redness with minimal draining to right breast and nasal region.  Was placed on Bactrim.  Plan at time of signout was to follow-up on labs and reassess patient after getting some fluids and Tylenol. ? ?Reassessed patient.  Lactic acid x2 is 3.5-3.6 despite proceeding 1 L of fluids.  Patient is still tachycardic to 120s.  She does not appear in any sort of distress, nontoxic.  Given the persistent tachycardia and the persistently elevated lactate, will admit for further observation and start some antibiotics in case these skin findings are causing cellulitis causing presentation today.  Will provide some additional fluids and antiemetic and pain medicine.  RN any chaperone, completed breast exam, there is no significant underlying induration to suggest underlying abscess but there is some skin discoloration to suggest cellulitis. ? ?Consult unassigned medicine.  ?  ?Lucrezia Starch, MD ?02/04/22 1733 ? ?

## 2022-02-04 NOTE — Hospital Course (Addendum)
3/17 ?Feeling great and wants to go home ?No chest pain, sob, palpitations, weakness, confusion, numbness/tingling  ?Itching is better but still present ?No fevers, chills, doesn't feel too hot or cold ? ?Lots of abd pain on palpation  ? ? ? ? ?She started feeling bad at work. Air condition was cold at work and she started feeling weak after going work. Whole body is throbbing.  ? ?Reports abscess in her navel and was started on bactrim 1.5 weeks ago ? ?Endorse chills, fever, nausea, muscle aches, headache, polydipsia, polyuria, leg pain ? ?Denies diarrhea, constipation, abd pain, chest pain, SOB ? ?PMH: Hyperthyroidism not currently on meds (none in last 2 months, diabetes ? ? ? ?Fhx: Stroke on mother side.  ? ? ?Shx: Moved here from [place].  ?

## 2022-02-04 NOTE — ED Notes (Signed)
EDP at BS 

## 2022-02-04 NOTE — ED Notes (Signed)
Admitting team at bedside to evaluate patient.

## 2022-02-04 NOTE — ED Provider Notes (Signed)
?[facility] EMERGENCY DEPARTMENT ?Provider Note ? ? ?CSN: [id number] ?Arrival date & time: 02/04/22  1342 ? ?  ? ?History ? ?Chief Complaint  ?Patient presents with  ? Generalized Body Aches  ? Chills  ? ? ?Penny Tanner is a 36 y.o. female. ? ?Patient is a 36 year old female with a history of newly diagnosed type 2 diabetes who is presenting today with complaint of generalized myalgias, chills, nausea without vomiting that all started today while she was at work.  She reports everything hurts and she feels terrible.  She denies any shortness of breath, chest pain or cough.  She has no sore throat.  She does feel nauseated but denies any abdominal pain.  Patient reports that she was recently given a prescription for metformin as they found that she was diabetic but she had not started taking it yet.  She also has been on Bactrim since last week for an abscess on her right breast and around her navel.  She does not feel like these areas are getting any worse.  She denies any diarrhea. ? ?The history is provided by the patient.  ? ?  ? ?Home Medications ?Prior to Admission medications   ?Medication Sig Start Date End Date Taking? Authorizing Provider  ?benzonatate (TESSALON) 100 MG capsule Take 1 capsule (100 mg total) by mouth 2 (two) times daily as needed for cough. 11/12/21   Roxy Horseman, PA-C  ?   ? ?Allergies    ?Patient has no known allergies.   ? ?Review of Systems   ?Review of Systems ? ?Physical Exam ?Updated Vital Signs ?BP 128/65   Pulse (!) 123   Temp 98.2 ?F (36.8 ?C) (Oral)   Resp (!) 22   Ht 5\' 11"  (1.803 m)   Wt 118 kg   SpO2 96%   BMI 36.28 kg/m?  ?Physical Exam ?Vitals and nursing note reviewed.  ?Constitutional:   ?   General: She is not in acute distress. ?   Appearance: She is well-developed.  ?HENT:  ?   Head: Normocephalic and atraumatic.  ?   Mouth/Throat:  ?   Mouth: Mucous membranes are dry.  ?Eyes:  ?   Pupils: Pupils are equal, round, and reactive to light.   ?Cardiovascular:  ?   Rate and Rhythm: Regular rhythm. Tachycardia present.  ?   Heart sounds: Normal heart sounds. No murmur heard. ?  No friction rub.  ?Pulmonary:  ?   Effort: Pulmonary effort is normal.  ?   Breath sounds: Normal breath sounds. No wheezing or rales.  ?Chest:  ? ? ?Abdominal:  ?   General: Bowel sounds are normal. There is no distension.  ?   Palpations: Abdomen is soft.  ?   Tenderness: There is no abdominal tenderness. There is no guarding or rebound.  ? ? ?Musculoskeletal:     ?   General: No tenderness. Normal range of motion.  ?   Comments: No edema  ?Skin: ?   General: Skin is warm and dry.  ?   Findings: No rash.  ?   Comments: Hot to the touch  ?Neurological:  ?   Mental Status: She is alert and oriented to person, place, and time. Mental status is at baseline.  ?   Cranial Nerves: No cranial nerve deficit.  ?Psychiatric:     ?   Mood and Affect: Mood normal.     ?   Behavior: Behavior normal.  ? ? ?ED Results / Procedures /  Treatments   ?Labs ?(all labs ordered are listed, but only abnormal results are displayed) ?Labs Reviewed  ?CULTURE, BLOOD (ROUTINE X 2)  ?CULTURE, BLOOD (ROUTINE X 2)  ?URINE CULTURE  ?RESP PANEL BY RT-PCR (FLU A&B, COVID) ARPGX2  ?LACTIC ACID, PLASMA  ?LACTIC ACID, PLASMA  ?COMPREHENSIVE METABOLIC PANEL  ?CBC WITH DIFFERENTIAL/PLATELET  ?PROTIME-INR  ?APTT  ?URINALYSIS, ROUTINE W REFLEX MICROSCOPIC  ?I-STAT BETA HCG BLOOD, ED ([facility], [facility], [facility] ONLY)  ? ? ?EKG ?None ? ?Radiology ?No results found. ? ?Procedures ?Procedures  ? ? ?Medications Ordered in ED ?Medications  ?acetaminophen (TYLENOL) tablet 1,000 mg (has no administration in time range)  ? ? ?ED Course/ Medical Decision Making/ A&P ?  ?                        ?Medical Decision Making ?Amount and/or Complexity of Data Reviewed ?Labs: ordered. Decision-making details documented in ED Course. ?Radiology: ordered and independent interpretation performed. Decision-making details documented in ED Course. ?ECG/medicine  tests: ordered and independent interpretation performed. Decision-making details documented in ED Course. ? ?Risk ?OTC drugs. ? ? ?Patient presenting today with sudden onset of chills, myalgias and nausea.  Patient feels very hot to the touch and is tachycardic here but sats are within normal limits.  She denies any shortness of breath, sore throat or particular URI symptoms however concern for early COVID or flu.  However patient also recently diagnosed with type 2 diabetes and has not started medication yet but complains of being extremely thirsty with polyuria and polydipsia.  Possible early DKA.  Also patient for the last week he has been on Bactrim for abscess on the right breast and around the umbilicus.  These areas are still present with some minimal draining however low suspicion but that they are the cause of the patient's infectious symptoms today.  Will give Tylenol, patient drinking oral fluids, labs are pending.  Undifferentiated sepsis order set initiated. ? ?I independently interpreted patient's EKG which shows sinus tachycardia without old to compare and her labs which her CBC is within normal limits,, COVID, coags, urine are within normal limits.  Lactic acid and CMP are still pending.  Patient given IV fluids and Tylenol for the fever.  She was checked out to Dr. Stevie Kern for further care.  I independently interpreted and visualized patient's chest x-ray which is negative. ? ? ? ? ? ? ? ?Final Clinical Impression(s) / ED Diagnoses ?Final diagnoses:  ?None  ? ? ?Rx / DC Orders ?ED Discharge Orders   ? ? None  ? ?  ? ? ?  ?Gwyneth Sprout, MD ?02/04/22 1553 ? ?

## 2022-02-05 ENCOUNTER — Observation Stay (HOSPITAL_COMMUNITY): Payer: BC Managed Care – PPO

## 2022-02-05 ENCOUNTER — Encounter (HOSPITAL_COMMUNITY): Payer: Self-pay | Admitting: Internal Medicine

## 2022-02-05 DIAGNOSIS — E871 Hypo-osmolality and hyponatremia: Secondary | ICD-10-CM | POA: Diagnosis present

## 2022-02-05 DIAGNOSIS — E1165 Type 2 diabetes mellitus with hyperglycemia: Secondary | ICD-10-CM | POA: Diagnosis present

## 2022-02-05 DIAGNOSIS — I959 Hypotension, unspecified: Secondary | ICD-10-CM | POA: Diagnosis present

## 2022-02-05 DIAGNOSIS — Z6836 Body mass index (BMI) 36.0-36.9, adult: Secondary | ICD-10-CM | POA: Diagnosis not present

## 2022-02-05 DIAGNOSIS — E872 Acidosis, unspecified: Secondary | ICD-10-CM | POA: Diagnosis present

## 2022-02-05 DIAGNOSIS — Z9114 Patient's other noncompliance with medication regimen: Secondary | ICD-10-CM | POA: Diagnosis present

## 2022-02-05 DIAGNOSIS — N611 Abscess of the breast and nipple: Secondary | ICD-10-CM | POA: Diagnosis present

## 2022-02-05 DIAGNOSIS — L732 Hidradenitis suppurativa: Secondary | ICD-10-CM | POA: Diagnosis present

## 2022-02-05 DIAGNOSIS — E669 Obesity, unspecified: Secondary | ICD-10-CM | POA: Diagnosis present

## 2022-02-05 DIAGNOSIS — E282 Polycystic ovarian syndrome: Secondary | ICD-10-CM | POA: Diagnosis present

## 2022-02-05 DIAGNOSIS — E0590 Thyrotoxicosis, unspecified without thyrotoxic crisis or storm: Secondary | ICD-10-CM | POA: Diagnosis present

## 2022-02-05 DIAGNOSIS — Z20822 Contact with and (suspected) exposure to covid-19: Secondary | ICD-10-CM | POA: Diagnosis present

## 2022-02-05 LAB — CBC
HCT: 31 % — ABNORMAL LOW (ref 36.0–46.0)
Hemoglobin: 10.2 g/dL — ABNORMAL LOW (ref 12.0–15.0)
MCH: 26.5 pg (ref 26.0–34.0)
MCHC: 32.9 g/dL (ref 30.0–36.0)
MCV: 80.5 fL (ref 80.0–100.0)
Platelets: 195 10*3/uL (ref 150–400)
RBC: 3.85 MIL/uL — ABNORMAL LOW (ref 3.87–5.11)
RDW: 12.8 % (ref 11.5–15.5)
WBC: 2.3 10*3/uL — ABNORMAL LOW (ref 4.0–10.5)
nRBC: 0 % (ref 0.0–0.2)

## 2022-02-05 LAB — GLUCOSE, CAPILLARY
Glucose-Capillary: 166 mg/dL — ABNORMAL HIGH (ref 70–99)
Glucose-Capillary: 182 mg/dL — ABNORMAL HIGH (ref 70–99)
Glucose-Capillary: 190 mg/dL — ABNORMAL HIGH (ref 70–99)
Glucose-Capillary: 204 mg/dL — ABNORMAL HIGH (ref 70–99)
Glucose-Capillary: 235 mg/dL — ABNORMAL HIGH (ref 70–99)
Glucose-Capillary: 255 mg/dL — ABNORMAL HIGH (ref 70–99)
Glucose-Capillary: 282 mg/dL — ABNORMAL HIGH (ref 70–99)

## 2022-02-05 LAB — RAPID URINE DRUG SCREEN, HOSP PERFORMED
Amphetamines: NOT DETECTED
Barbiturates: NOT DETECTED
Benzodiazepines: NOT DETECTED
Cocaine: NOT DETECTED
Opiates: NOT DETECTED
Tetrahydrocannabinol: NOT DETECTED

## 2022-02-05 LAB — COMPREHENSIVE METABOLIC PANEL
ALT: 19 U/L (ref 0–44)
AST: 37 U/L (ref 15–41)
Albumin: 2.6 g/dL — ABNORMAL LOW (ref 3.5–5.0)
Alkaline Phosphatase: 115 U/L (ref 38–126)
Anion gap: 9 (ref 5–15)
BUN: <5 mg/dL — ABNORMAL LOW (ref 6–20)
CO2: 22 mmol/L (ref 22–32)
Calcium: 8.2 mg/dL — ABNORMAL LOW (ref 8.9–10.3)
Chloride: 102 mmol/L (ref 98–111)
Creatinine, Ser: 0.51 mg/dL (ref 0.44–1.00)
GFR, Estimated: >60 mL/min (ref >60)
Glucose, Bld: 263 mg/dL — ABNORMAL HIGH (ref 70–99)
Potassium: 3.6 mmol/L (ref 3.5–5.1)
Sodium: 133 mmol/L — ABNORMAL LOW (ref 135–145)
Total Bilirubin: 0.5 mg/dL (ref 0.3–1.2)
Total Protein: 5.9 g/dL — ABNORMAL LOW (ref 6.5–8.1)

## 2022-02-05 LAB — HEMOGLOBIN A1C
Hgb A1c MFr Bld: 11.7 % — ABNORMAL HIGH (ref 4.8–5.6)
Mean Plasma Glucose: 289 mg/dL

## 2022-02-05 LAB — URINE CULTURE

## 2022-02-05 LAB — RPR: RPR Ser Ql: NONREACTIVE

## 2022-02-05 LAB — T3: T3, Total: 234 ng/dL — ABNORMAL HIGH (ref 71–180)

## 2022-02-05 MED ORDER — METHIMAZOLE 10 MG PO TABS
20.0000 mg | ORAL_TABLET | Freq: Two times a day (BID) | ORAL | Status: DC
  Administered 2022-02-05 – 2022-02-07 (×4): 20 mg via ORAL
  Filled 2022-02-05 (×5): qty 2

## 2022-02-05 MED ORDER — SODIUM CHLORIDE 0.9 % IV SOLN
2.0000 g | INTRAVENOUS | Status: DC
  Administered 2022-02-05 – 2022-02-06 (×2): 2 g via INTRAVENOUS
  Filled 2022-02-05 (×3): qty 20

## 2022-02-05 MED ORDER — PROPRANOLOL HCL 60 MG PO TABS
60.0000 mg | ORAL_TABLET | Freq: Four times a day (QID) | ORAL | Status: DC
  Administered 2022-02-06: 60 mg via ORAL
  Filled 2022-02-05 (×6): qty 1

## 2022-02-05 MED ORDER — LIVING WELL WITH DIABETES BOOK
Freq: Once | Status: AC
Start: 2022-02-05 — End: 2022-02-05
  Filled 2022-02-05: qty 1

## 2022-02-05 NOTE — Progress Notes (Addendum)
? ?HD#0 ?SUBJECTIVE:  ?Patient Summary: Penny Tanner is a 36 year old female with past medical history of untreated hypothyroidism, hidradenitis suppurativa, recently diagnosed type 2 diabetes mellitus who presents after sudden onset of generalized malaise, severe body aches, and chills at work today admitted for further work up of infectious vs. Endocrine derangement. ? ?Overnight Events: Patient was evaluated by PCCM after thyroid labs and clinical picture were concerning for thyrotoxicosis and was started on propranolol and methimazole and admitted to progressive unit.  ? ?Interim History: Patient evaluated at bedside and states that she feels significantly better than yesterday. Clinically she looks remarkably improved as well. She ahs not had recurrence of nausea or vomiting and has not had any GI upset. The numbness and tingling she reported in her legs and back at admission are gone and her diffuse myalgias and joint pains have resolved. She denies dysuria or vaginal itching/discomfort. No chest pain or dyspnea. She feels a little sweaty at this time but has had resolution of fever and chills. She is diffusely itchy which is not new. ? ?OBJECTIVE:  ?Vital Signs: ?Vitals:  ? 02/04/22 2151 02/04/22 2354 02/05/22 0408 02/05/22 9518  ?BP: 100/76 122/63 111/63 (!) 94/47  ?Pulse: (!) 135 (!) 116 98   ?Resp: (!) 21 (!) 21 20 16   ?Temp: (!) 103 ?F (39.4 ?C) 99.1 ?F (37.3 ?C) 98.9 ?F (37.2 ?C) 98.7 ?F (37.1 ?C)  ?TempSrc: Oral Oral Oral Oral  ?SpO2: 96% 97% 95% 97%  ?Weight:      ?Height:      ? ?Supplemental O2: Room Air ?SpO2: 97 % ? ?Filed Weights  ? 02/04/22 1409  ?Weight: 118 kg  ? ? ?No intake or output data in the 24 hours ending 02/05/22 0737 ?Net IO Since Admission: No IO data has been entered for this period [02/05/22 0737] ? ?Physical Exam: ?Constitutional:Patient is sleeping peacefully on room entry, is in no acute distress on awakening. ?Eyes:Normal conjunctiva.  ?Neck:No thyromegaly. No tenderness  to palpation. ?Cardio:Regular rate and rhythm. No murmurs, rubs, gallops. ?Pulm:Clear to auscultation bilaterally. Normal work of breathing on room air. ?Abdomen:Soft, nontender, nondistended. ?02/07/22 for extremity edema. ?Skin:Skin is warm with diaphoresis. Abscess sites over umbilicus and at 6 o'clock position of R breast with unchanged erythema, no obvious discomfort when palpated and no drainage. R breast with some skin texture changes, somewhat resembling Peau d'orange near site of abscess.  ?Neuro:Alert and oriented x3. No focal deficit noted. ?Psych:Normal mood and affect. Tearful when talking about being away from her toddler while hospitalized. ? ?Patient Lines/Drains/Airways Status   ? ? Active Line/Drains/Airways   ? ? Name Placement date Placement time Site Days  ? Peripheral IV 02/04/22 22 G Left;Posterior Hand 02/04/22  1525  Hand  1  ? ?  ?  ? ?  ? ? ? ?ASSESSMENT/PLAN:  ?Assessment: ?Principal Problem: ?  Severe hyperglycemia due to diabetes mellitus (HCC) ? ? ?Plan: ?Acute Thyrotoxicosis from untreated hyperthyroidism ?Patient has had a remarkable turn around clinically overnight--no longer showing signs of restlessness, severe diffuse myalgias and joint pain, nausea with vomiting, fever, tachycardia, tachypnea, injected conjunctiva. TSH labs resulted with TSH <0.010 and T4 2.32, T3 234. PCCM was consulted overnight felt that she was stable for a progressive level of care and started her on methimazole 20 mg q4h and propranolol 60 mg q4h. When asked further she states that she was treated briefly in the past with what she thinks was methimazole. BP soft but stable, afebrile, without leukocytosis. Given  clinical improvement with overnight adjustments I don't think that she needs glucocorticoid or iodide therapy at this time. ?-Adjust methimazole to 20 mg BID ?-Adjust propranolol to 60 mg q6h ?-Thyroid ultrasound ordered ?-Patient will need close follow-up with endocrinology as outpatient   ?-Repeat Free T4, T3 in AM ? ?SIRS-positive, resolved ?Patient had improvement of elevated lactic acid to 2.3 with 2L IVF. She received maintenance fluids overnight as well. MRSA nasal swab negative, magnesium low on admission at 1.3 with repletion overnight, CK and GGT normal, RPR negative, troponins flat. Blood culture showing NGTD, and urine culture is still pending. No longer tachycardic or tachypneic, no leukocytosis (WBC low actually this morning), afebrile. Given multiple abscesses there is still a possibility that she has an infectious process that pushed her untreated hyperthyroidism over into thyrotoxicosis and we will continue to assess infectious etiologies. ?-F/u blood culture ?-F/u urine culture ?-Ceftriaxone 2 g IV daily, day 2 ?-Trend CBC, fever curve ?-Recommend further breast imaging as outpatient for skin changes noted above ?  ?Type 2 diabetes mellitus with hyperglycemia ?HbA1c 11.7%. She received a one-time dose of insulin and was initiated on SSI q4h and Levemir 10 units at bedtime which has shown improvement of CBG. She was prescribed metformin two weeks ago but had not had the chance to pick this up and start the medication yet. Uncontrolled diabetes may have played a role in development thyrotoxicosis as well. UA on admission showed yeast which is not surprising given uncontrolled diabetes; at this time, urine culture is pending and will lead decision regarding treatment. ?-Continue SSI q4h ?-Continue Levemir 10 units at bedtime ?-Will need home blood glucometer at discharge ? ? ?Best Practice: ?Diet: Diabetic diet ?IVF: Fluids: none ?VTE: enoxaparin (LOVENOX) injection 40 mg Start: 02/04/22 1830 ?Code: Full ?AB: Ceftriaxone 2 g IV daily, day 2 ?DISPO: Anticipated discharge in 1-2 days to Home pending Medical stability. ? ?Signature: ?Champ Mungo, D.O.  ?Internal Medicine Resident, PGY-1 ?[facility]  ?Pager: [phone] ?7:37 AM, 02/05/2022  ? ?Please contact the  on call pager after 5 pm and on weekends at [phone]. ? ?

## 2022-02-05 NOTE — Progress Notes (Signed)
Mobility Specialist Progress Note  ? ? 02/05/22 1713  ?Mobility  ?Activity Ambulated independently in hallway  ?Level of Assistance Independent  ?Assistive Device None  ?Distance Ambulated (ft) 420 ft  ?Activity Response Tolerated well  ?$Mobility charge 1 Mobility  ? ?Pt received in bed and agreeable. No complaints. Had void in BR then returned to bed with call bell in reach.  ? ?[name] ?Mobility Specialist  ?M.S. 5N: [phone]  ?

## 2022-02-05 NOTE — Plan of Care (Signed)

## 2022-02-05 NOTE — Progress Notes (Addendum)
Inpatient Diabetes Program Recommendations ? ?AACE/ADA: New Consensus Statement on Inpatient Glycemic Control (2015) ? ?Target Ranges:  Prepandial:   less than 140 mg/dL ?     Peak postprandial:   less than 180 mg/dL (1-2 hours) ?     Critically ill patients:  140 - 180 mg/dL  ? ?Lab Results  ?Component Value Date  ? GLUCAP 204 (H) 02/05/2022  ? HGBA1C 11.7 (H) 02/04/2022  ? ? ?Review of Glycemic Control ? ?Diabetes history: type 2 (had gestational diabetes) ?Outpatient Diabetes medications: none ?Current orders for Inpatient glycemic control: Levemir 10 units at HS, Novolog MODERATE correction scale every 4 hours ? ?Inpatient Diabetes Program Recommendations:   ?Spoke with patient about the new diagnosis of diabetes. She was diagnosed by her OB/Gyn physician about 2 weeks ago. States that she did have gestational diabetes and was taking insulin at that time. She was using vial and syringe. States that she does not have health insurance, but works for [employer].  ? ?Reviewed A1C of 11.7%, normal blood sugar, checking blood sugars at home. She does not have a home blood glucose meter at that this time, since she has moved. Will need prescription for home blood glucose meter (discharge order #[id number]) States that she is very familiar with foods that she can and cannot eat due to having followed a strict diet when she had gestational diabetes. Will need to follow up with a PCP and will need affordable insulin if started on insulin at home.  ? ?Reviewed the insulin pen with patient. She was able to follow the steps without issues. Will order Living Well with Diabetes booklet from pharmacy.  ? ?Will continue to monitor blood sugars while in the hospital.  ? ?Smith Mince RN BSN CDE ?Diabetes Coordinator ?Pager: [phone]  8am-5pm  ? ? ?

## 2022-02-05 NOTE — Plan of Care (Signed)

## 2022-02-06 DIAGNOSIS — Z6836 Body mass index (BMI) 36.0-36.9, adult: Secondary | ICD-10-CM

## 2022-02-06 DIAGNOSIS — E669 Obesity, unspecified: Secondary | ICD-10-CM

## 2022-02-06 LAB — CBC
HCT: 31.5 % — ABNORMAL LOW (ref 36.0–46.0)
Hemoglobin: 10.3 g/dL — ABNORMAL LOW (ref 12.0–15.0)
MCH: 26.3 pg (ref 26.0–34.0)
MCHC: 32.7 g/dL (ref 30.0–36.0)
MCV: 80.4 fL (ref 80.0–100.0)
Platelets: 200 10*3/uL (ref 150–400)
RBC: 3.92 MIL/uL (ref 3.87–5.11)
RDW: 12.7 % (ref 11.5–15.5)
WBC: 3.4 10*3/uL — ABNORMAL LOW (ref 4.0–10.5)
nRBC: 0 % (ref 0.0–0.2)

## 2022-02-06 LAB — COMPREHENSIVE METABOLIC PANEL
ALT: 24 U/L (ref 0–44)
AST: 35 U/L (ref 15–41)
Albumin: 2.7 g/dL — ABNORMAL LOW (ref 3.5–5.0)
Alkaline Phosphatase: 125 U/L (ref 38–126)
Anion gap: 7 (ref 5–15)
BUN: <5 mg/dL — ABNORMAL LOW (ref 6–20)
CO2: 22 mmol/L (ref 22–32)
Calcium: 8.4 mg/dL — ABNORMAL LOW (ref 8.9–10.3)
Chloride: 102 mmol/L (ref 98–111)
Creatinine, Ser: 0.49 mg/dL (ref 0.44–1.00)
GFR, Estimated: >60 mL/min (ref >60)
Glucose, Bld: 188 mg/dL — ABNORMAL HIGH (ref 70–99)
Potassium: 3.9 mmol/L (ref 3.5–5.1)
Sodium: 131 mmol/L — ABNORMAL LOW (ref 135–145)
Total Bilirubin: 0.3 mg/dL (ref 0.3–1.2)
Total Protein: 6.3 g/dL — ABNORMAL LOW (ref 6.5–8.1)

## 2022-02-06 LAB — GLUCOSE, CAPILLARY
Glucose-Capillary: 146 mg/dL — ABNORMAL HIGH (ref 70–99)
Glucose-Capillary: 146 mg/dL — ABNORMAL HIGH (ref 70–99)
Glucose-Capillary: 150 mg/dL — ABNORMAL HIGH (ref 70–99)
Glucose-Capillary: 136 mg/dL — ABNORMAL HIGH (ref 70–99)
Glucose-Capillary: 202 mg/dL — ABNORMAL HIGH (ref 70–99)

## 2022-02-06 LAB — T4, FREE: Free T4: 2.41 ng/dL — ABNORMAL HIGH (ref 0.61–1.12)

## 2022-02-06 MED ORDER — PROPRANOLOL HCL 60 MG PO TABS
60.0000 mg | ORAL_TABLET | ORAL | Status: DC
Start: 2022-02-06 — End: 2022-02-07
  Administered 2022-02-06 – 2022-02-07 (×6): 60 mg via ORAL
  Filled 2022-02-06 (×9): qty 1

## 2022-02-06 MED ORDER — INSULIN DETEMIR 100 UNIT/ML ~~LOC~~ SOLN
15.0000 [IU] | Freq: Every day | SUBCUTANEOUS | Status: DC
Start: 2022-02-06 — End: 2022-02-07
  Administered 2022-02-06: 15 [IU] via SUBCUTANEOUS
  Filled 2022-02-06 (×2): qty 0.15

## 2022-02-06 NOTE — Plan of Care (Signed)

## 2022-02-06 NOTE — Progress Notes (Signed)
? ?HD#1 ?SUBJECTIVE:  ?Patient Summary: Penny Tanner is a 36 year old female with past medical history of untreated hypothyroidism, hidradenitis suppurativa, recently diagnosed type 2 diabetes mellitus who presents after sudden onset of generalized malaise, severe body aches, and chills at work today admitted for further work up of infectious vs. Endocrine derangement. ? ?Overnight Events: None ? ?Interim History: Patient says she is ready to go home today and is feeling much better with no complaints today. Her itching has resolved at this time. ? ?OBJECTIVE:  ?Vital Signs: ?Vitals:  ? 02/06/22 0300 02/06/22 0424 02/06/22 0730 02/06/22 1114  ?BP:  112/61 (!) 99/45 94/78  ?Pulse: 98 100 100 93  ?Resp: 17 19 (!) 26   ?Temp:  98.8 ?F (37.1 ?C) 98.8 ?F (37.1 ?C)   ?TempSrc:  Oral    ?SpO2: 95% 96% 91% 94%  ?Weight:      ?Height:      ? ?Supplemental O2: Room Air ?SpO2: 94 % ? ?Filed Weights  ? 02/04/22 1409  ?Weight: 118 kg  ? ? ? ?Intake/Output Summary (Last 24 hours) at 02/06/2022 1345 ?Last data filed at 02/06/2022 0500 ?Gross per 24 hour  ?Intake 960 ml  ?Output --  ?Net 960 ml  ? ?Net IO Since Admission: 1,440 mL [02/06/22 1345] ? ?Physical Exam: ?Constitutional:Patient is sleeping peacefully in no acute distress. ?Cardio:Regular rate and rhythm. No murmurs, rubs, gallops. ?Pulm:Clear to auscultation bilaterally. Normal work of breathing on room air. ?Abdomen:Soft, nontender with exception of abscess area, nondistended. ?MSK:No extremity edema noted, no excoriations. ?Skin:Warm and dry, no diaphoresis. ?Neuro:Alert and oriented x3. No focal deficit noted. ?Psych:Normal mood and affect. ? ?Patient Lines/Drains/Airways Status   ? ? Active Line/Drains/Airways   ? ? Name Placement date Placement time Site Days  ? Peripheral IV 02/05/22 20 G 1.88" Anterior;Left Forearm 02/05/22  1144  Forearm  1  ? ?  ?  ? ?  ? ? ? ?ASSESSMENT/PLAN:  ?Assessment: ?Principal Problem: ?  Acute thyrotoxicosis ?Active Problems: ?   Severe hyperglycemia due to diabetes mellitus (HCC) ?  Thyrotoxicosis ?  BMI 36.0-36.9,adult ? ? ?Plan: ?Acute thyrotoxicosis from untreated hyperthyroidism ?Patient has been overall stable since initiating methimazole and propranolol at admission. She has had some episodes of hypotension and borderline tachycardia but is overall hemodynamically stable. Thyroid ultrasound 03/16 showed markedly heterogeneous, mildly enlarged thyroid gland without discrete nodule, and these findings are "nonspecific but could be seen with thyroiditis or autoimmune thyroid disorder." Repeat T4 increased to 2.41 from 2.32 and repeat T3 is pending at this time. ?-Methimazole to 20 mg BID ?-Change propranolol back to 60 mg q4h ?-F/u T3 ?-Patient will need close follow-up with endocrinology as outpatient  ?  ?SIRS-positive, resolved ?Leukocytosis improved overnight, now 3.4. It decreased from admission after initiating methimazole Blood culture shows NGTD, urine culture suggested recollection but she is asymptomatic and I do not feel that it is the cause of her presentation.  ?-Ceftriaxone 2 g IV daily, day 3 ? -Will transition to amoxicillin 875 BID plus doxycycline 100 BID on discharge for total of 7 days of treatment ?-Trend CBC, fever curve ?-Recommend further breast imaging as outpatient for skin changes noted above ?  ?Type 2 diabetes mellitus with hyperglycemia ?CBG have been improving since admission, three most recent values <150. ?-Continue SSI q4h ?-Continue Levemir 10 units at bedtime ?-Will need home blood glucometer at discharge ? ?Best Practice: ?Diet: Diabetic diet ?IVF: Fluids: none ?VTE: enoxaparin (LOVENOX) injection 40 mg Start: 02/04/22 1830 ?Code:  Full ?AB: Ceftriaxone 2 g IV daily, day 3 ?DISPO: Anticipated discharge in 1-2 days to Home pending Medical stability. ? ?Signature: ?Champ Mungo, D.O.  ?Internal Medicine Resident, PGY-1 ?[facility]  ?Pager: [phone] ?1:45 PM, 02/06/2022   ? ?Please contact the on call pager after 5 pm and on weekends at [phone]. ? ?

## 2022-02-06 NOTE — TOC Initial Note (Addendum)
Transition of Care (TOC) - Initial/Assessment Note  ? ? ?Patient Details  ?Name: Penny Tanner ?MRN: [id number] ?Date of Birth: Aug 10, 1986 ? ?Transition of Care (TOC) CM/SW Contact:    ?Angelita Ingles, RN ?Phone Number:[phone] ? ?02/06/2022, 2:06 PM ? ?Clinical Narrative:                 ? ?Transition of Care (TOC) Screening Note ? ? ?Patient Details  ?Name: Penny Tanner ?Date of Birth: September 11, 1986 ? ? ?Transition of Care (TOC) CM/SW Contact:    ?Angelita Ingles, RN ?Phone Number: ?02/06/2022, 2:06 PM ? ? ? ?Transition of Care Department [facility]) has reviewed patient and no TOC needs have been identified at this time. We will continue to monitor patient advancement through interdisciplinary progression rounds. ? ? ? ?  ?  ? ? ?Patient Goals and CMS Choice ?  ?  ?  ? ?Expected Discharge Plan and Services ?  ?  ?  ?  ?  ?                ?  ?  ?  ?  ?  ?  ?  ?  ?  ?  ? ?Prior Living Arrangements/Services ?  ?  ?  ?       ?  ?  ?  ?  ? ?Activities of Daily Living ?  ?  ? ?Permission Sought/Granted ?  ?  ?   ?   ?   ?   ? ?Emotional Assessment ?  ?  ?  ?  ?  ?  ? ?Admission diagnosis:  Severe hyperglycemia due to diabetes mellitus ([place]) [E11.65] ?Thyrotoxicosis [E05.90] ?Patient Active Problem List  ? Diagnosis Date Noted  ? Acute thyrotoxicosis 02/05/2022  ? Thyrotoxicosis 02/05/2022  ? BMI 36.0-36.9,adult 02/05/2022  ? Severe hyperglycemia due to diabetes mellitus ([place]) 02/04/2022  ? ?PCP:  Pcp, No ?Pharmacy:   ?[place] [address] ?[place] [address] ?Phone: [phone] Fax: [phone] ? ? ? ? ?Social Determinants of Health (SDOH) Interventions ?  ? ?Readmission Risk Interventions ?No flowsheet data found. ? ? ?

## 2022-02-07 LAB — GLUCOSE, CAPILLARY
Glucose-Capillary: 152 mg/dL — ABNORMAL HIGH (ref 70–99)
Glucose-Capillary: 173 mg/dL — ABNORMAL HIGH (ref 70–99)
Glucose-Capillary: 193 mg/dL — ABNORMAL HIGH (ref 70–99)
Glucose-Capillary: 217 mg/dL — ABNORMAL HIGH (ref 70–99)

## 2022-02-07 LAB — CBC
HCT: 32.6 % — ABNORMAL LOW (ref 36.0–46.0)
Hemoglobin: 10.7 g/dL — ABNORMAL LOW (ref 12.0–15.0)
MCH: 26.2 pg (ref 26.0–34.0)
MCHC: 32.8 g/dL (ref 30.0–36.0)
MCV: 79.9 fL — ABNORMAL LOW (ref 80.0–100.0)
Platelets: 206 10*3/uL (ref 150–400)
RBC: 4.08 MIL/uL (ref 3.87–5.11)
RDW: 12.5 % (ref 11.5–15.5)
WBC: 3 10*3/uL — ABNORMAL LOW (ref 4.0–10.5)
nRBC: 0 % (ref 0.0–0.2)

## 2022-02-07 LAB — BASIC METABOLIC PANEL
Anion gap: 8 (ref 5–15)
BUN: 6 mg/dL (ref 6–20)
CO2: 24 mmol/L (ref 22–32)
Calcium: 8.6 mg/dL — ABNORMAL LOW (ref 8.9–10.3)
Chloride: 104 mmol/L (ref 98–111)
Creatinine, Ser: 0.44 mg/dL (ref 0.44–1.00)
GFR, Estimated: >60 mL/min (ref >60)
Glucose, Bld: 181 mg/dL — ABNORMAL HIGH (ref 70–99)
Potassium: 3.6 mmol/L (ref 3.5–5.1)
Sodium: 136 mmol/L (ref 135–145)

## 2022-02-07 LAB — MAGNESIUM: Magnesium: 1.8 mg/dL (ref 1.7–2.4)

## 2022-02-07 LAB — T4, FREE: Free T4: 2.15 ng/dL — ABNORMAL HIGH (ref 0.61–1.12)

## 2022-02-07 LAB — T3: T3, Total: 181 ng/dL — ABNORMAL HIGH (ref 71–180)

## 2022-02-07 MED ORDER — AMOXICILLIN 875 MG PO TABS: 875.0000 mg | ORAL_TABLET | Freq: Two times a day (BID) | ORAL | 0 refills | Status: AC

## 2022-02-07 MED ORDER — DOXYCYCLINE MONOHYDRATE 100 MG PO TABS: 100.0000 mg | ORAL_TABLET | Freq: Two times a day (BID) | ORAL | 0 refills | Status: AC

## 2022-02-07 MED ORDER — INSULIN DETEMIR 100 UNIT/ML ~~LOC~~ SOLN: 15.0000 [IU] | Freq: Every day | SUBCUTANEOUS | 2 refills | Status: DC

## 2022-02-07 MED ORDER — BLOOD GLUCOSE METER KIT: PACK | 0 refills | Status: AC

## 2022-02-07 MED ORDER — METHIMAZOLE 10 MG PO TABS: 20.0000 mg | ORAL_TABLET | Freq: Two times a day (BID) | ORAL | 2 refills | Status: AC

## 2022-02-07 MED ORDER — PROPRANOLOL HCL 60 MG PO TABS: 60.0000 mg | ORAL_TABLET | ORAL | 3 refills | Status: AC

## 2022-02-07 NOTE — Discharge Summary (Signed)
? ?Name: Penny Tanner ?MRN: [id number] ?DOB: 19-Sep-1986 36 y.o. ?PCP: Pcp, No ? ?Date of Admission: 02/04/2022  1:48 PM ?Date of Discharge: 02/07/2022 ?Attending Physician: Dr. Cain Sieve ? ?Discharge Diagnosis: ?Principal Problem: ?  Acute thyrotoxicosis ?Active Problems: ?  Severe hyperglycemia due to diabetes mellitus ([place]) ?  Thyrotoxicosis ?  BMI 36.0-36.9,adult ?  Obesity ?  ? ?Discharge Medications: ?Allergies as of 02/07/2022   ?No Known Allergies ?  ? ?  ?Medication List  ?  ? ?STOP taking these medications   ? ?benzonatate 100 MG capsule ?Commonly known as: TESSALON ?  ?sulfamethoxazole-trimethoprim 800-160 MG tablet ?Commonly known as: BACTRIM DS ?  ? ?  ? ?TAKE these medications   ? ?amoxicillin 875 MG tablet ?Commonly known as: AMOXIL ?Take 1 tablet (875 mg total) by mouth 2 (two) times daily for 3 days. ?  ?blood glucose meter kit and supplies ?Dispense based on patient and insurance preference. Use up to four times daily as directed. (FOR ICD-10 E10.9, E11.9). ?  ?doxycycline 100 MG tablet ?Commonly known as: ADOXA ?Take 1 tablet (100 mg total) by mouth 2 (two) times daily for 3 days. ?  ?insulin detemir 100 UNIT/ML injection ?Commonly known as: LEVEMIR ?Inject 0.15 mLs (15 Units total) into the skin at bedtime. ?  ?methimazole 10 MG tablet ?Commonly known as: TAPAZOLE ?Take 2 tablets (20 mg total) by mouth 2 (two) times daily. ?  ?propranolol 60 MG tablet ?Commonly known as: INDERAL ?Take 1 tablet (60 mg total) by mouth every 4 (four) hours. ?  ? ?  ? ? ?Disposition and follow-up:   ?PennyPenny Tanner was discharged from [facility] in Stable condition.  At the hospital follow up visit please address: ? ?1.  Follow-up: ? a.  Acute thyrotoxicosis: Found to have elevated T3 and free T4 on admission.  Started on methimazole and propanolol with complete resolution of symptoms. Recheck thyroid labs and consider adjustments to current regimen. ?  ? b.  Hidradenitis suppurativa: Failed  outpatient therapy with Bactrim.  Received IV Rocephin during hospitalization and was discharged home on 3 days of doxycycline and amoxicillin to complete a 7-day course.  Assess for improvement in symptoms and consider breast imaging. ? ? c.  Uncontrolled type 2 diabetes: Untreated type 2 diabetes.  A1c 11.7% on admission. Started patient on Levemir 15 units at bedtime during hospitalization. Patient is currently self-pay, to consider adding an oral antidiabetic agent such as GLP-1 agonist that she can afford. Check patient's blood glucose meter and make adjustment to insulin regimen at your discretion. ? ? ?2.  Labs / imaging needed at time of follow-up: TSH, free T4, T3 ? ?3.  Pending labs/ test needing follow-up: None ? ?Follow-up Appointments: ?[facility] appointment on 02/12/2022 ?[facility] appointment on 03/10/2022 ? ?Hospital Course by problem list: ?#Acute thyrotoxicosis from untreated hyperthyroidism ?Patient presented to the ER on 3/16 with nonspecific symptoms such as subjective fever, muscle aches and chills that started while she was at work on the day of admission. She was noted to be tachycardic and tachypneic but afebrile. Patient was started on broad-spectrum antibiotics and IV fluid due to concern for possible sepsis. Further labs revealed thyrotoxicosis.  Pulmonary and critical care was consulted and patient was started on propanolol and metronidazole on day of admission.  Thyroid ultrasound on 03/16 showed markedly heterogeneous, mildly enlarged thyroid gland without discrete nodule, and these findings are "nonspecific but could be seen with thyroiditis or autoimmune thyroid disorder." Patient  had significant improvement in her symptoms the next day. Patient's propranolol was adjusted to control her heart rate.  She remained hemodynamically stable throughout her short hospitalization. Patient was discharged on 3/18 after her thyroid labs started trending down. She was discharged home on  methimazole 20 mg twice daily and propranolol 60 mg every 4 hours. Patient is scheduled to follow-up with [facility] for hospital follow-up, lab work and adjustment to medications. Patient will also benefit from close follow-up with endocrinology in the outpatient. ?  ?#Hidradenitis suppurativa ?Patient reported history of hidradenitis suppurativa in her family.  She was recently prescribed Bactrim for abscess/soft tissue infection on her breast and umbilical area without significant improvement.  Patient was placed on broad-spectrum antibiotics (IV vancomycin and ceftriaxone) and IV fluid due to initial concern for sepsis in the setting of her infection.  Vancomycin was discontinued the day after admission and patient remained on IV ceftriaxone until discharge. Infectious work-up was negative for flu and COVID.  Chest x-ray was unrevealing.  Blood cultures had no growth after 3 days.  Patient was discharged home on 3 days of amoxicillin and doxycycline to complete a total of 7 days of antibiotics.  Patient is scheduled to follow-up with surgery in April. ? ?#Type 2 diabetes mellitus with hyperglycemia ?Patient reports being diagnosed with type 2 diabetes 2 weeks prior to admission. She was not on any medications but have plans to follow-up with endocrinology next month. She was found to have a blood glucose of 312 with urinalysis showing glucose >500 body and yeast. Patient was not in diabetic ketoacidosis but was significantly dehydrated. A1c on admission was 11.7%. Patient was started on sliding scale and Levemir 10 units at bedtime with escalation to 15 units by day of discharge.  Patient was discharged home on Levemir 15 units at bedtime and blood glucose monitoring kit. She will need close outpatient follow-up to manage her diabetes. ? ?Subjective: ?Patient evaluated at bedside. States she is feeling great and ready to go home. She denies any chest pain, sob, palpitations, weakness, confusion, numbness/tingling,  fevers or chills.  Patient was informed of improvement in thyroid labs and plan for discharge today. ? ?Objective: ?Discharge Vitals:   ?BP 106/82 (BP Location: Left Arm)   Pulse 82   Temp 98.9 ?F (37.2 ?C)   Resp 19   Ht '5\' 11"'  (1.803 m)   Wt 118 kg   SpO2 98%   BMI 36.28 kg/m?  ?General: Pleasant, well-appearing obese middle-aged woman laying in bed. No acute distress. ?CV: RRR. No murmurs, rubs, or gallops. No LE edema ?Pulmonary: Lungs CTAB. Normal effort. No wheezing or rales. ?Abdominal: Soft, nondistended. Umbilical abscess with mild drainage, no erythema. Tenderness to palpation around umbilical area. ?Extremities: Radial and DP pulses 2+ and symmetric. ?Skin: Warm, with mild diaphoresis.  ?Neuro: A&Ox3. Moves all extremities. Normal sensation to gross touch. ?Psych: Normal mood and affect ? ?Pertinent Labs, Studies, and Procedures:  ?CBC Latest Ref Rng & Units 02/07/2022 02/06/2022 02/05/2022  ?WBC 4.0 - 10.5 K/uL 3.0(L) 3.4(L) 2.3(L)  ?Hemoglobin 12.0 - 15.0 g/dL 10.7(L) 10.3(L) 10.2(L)  ?Hematocrit 36.0 - 46.0 % 32.6(L) 31.5(L) 31.0(L)  ?Platelets 150 - 400 K/uL 206 200 195  ? ? ?CMP Latest Ref Rng & Units 02/07/2022 02/06/2022 02/05/2022  ?Glucose 70 - 99 mg/dL 181(H) 188(H) 263(H)  ?BUN 6 - 20 mg/dL 6 <5(L) <5(L)  ?Creatinine 0.44 - 1.00 mg/dL 0.44 0.49 0.51  ?Sodium 135 - 145 mmol/L 136 131(L) 133(L)  ?Potassium 3.5 -  5.1 mmol/L 3.6 3.9 3.6  ?Chloride 98 - 111 mmol/L 104 102 102  ?CO2 22 - 32 mmol/L '24 22 22  ' ?Calcium 8.9 - 10.3 mg/dL 8.6(L) 8.4(L) 8.2(L)  ?Total Protein 6.5 - 8.1 g/dL - 6.3(L) 5.9(L)  ?Total Bilirubin 0.3 - 1.2 mg/dL - 0.3 0.5  ?Alkaline Phos 38 - 126 U/L - 125 115  ?AST 15 - 41 U/L - 35 37  ?ALT 0 - 44 U/L - 24 19  ? ? ?DG Chest Port 1 View ? ?Result Date: 02/04/2022 ?CLINICAL DATA:  Questionable sepsis EXAM: PORTABLE CHEST 1 VIEW COMPARISON:  None. FINDINGS: The heart size and mediastinal contours are within normal limits. Both lungs are clear. The visualized skeletal structures are  unremarkable. IMPRESSION: No acute abnormality of the lungs in AP portable projection. Electronically Signed   By: Delanna Ahmadi M.D.   On: 02/04/2022 14:38  ? ?US THYROID ? ?Result Date: 02/05/2022 ?CLINICAL DATA:  Hyper

## 2022-02-07 NOTE — Progress Notes (Signed)
Mobility Specialist Progress Note ? ? 02/07/22 1100  ?Mobility  ?Activity Ambulated independently in hallway  ?Level of Assistance Independent after set-up  ?Assistive Device None  ?Distance Ambulated (ft) 410 ft  ?Activity Response Tolerated well  ?$Mobility charge 1 Mobility  ? ?Received pt in bed having no complaints and agreeable to mobility. Slight SOB towards end of session but otherwise asymptomatic throughout ambulation. Returned back to bed w/ call bell in reach and all needs met. ? ?Holland Falling ?Mobility Specialist ?Phone Number [phone] ? ?

## 2022-02-07 NOTE — Care Management (Signed)
MATCH letter provided to patient prior to discharge. Explained MATCH details to Ms. Maiden. Denies having any transportation needs.  ? ?No further TOC needs identified.  ? ? ?Raiford Noble, MSN, RN,BSN ?[facility]patient [facility] Case Manager ?[phone]   ?

## 2022-02-07 NOTE — Discharge Instructions (Signed)
Penny Tanner,  ?It was a pleasure taking care of you at [facility] were admitted for muscle aches, fevers and chills and treated for thyrotoxicosis.  This is due to not being on a thyroid medication causing your thyroid levels to be extremely high in your body. We are discharging you home now that you are doing better. Please follow the following instructions.  ?1) Continue taking methimazole 20 mg twice daily ?2) Continue taking propranolol 60 mg every 4 hours ?3) Take doxycycline 100 mg and Amoxicillin 875 mg twice daily for 3 days. ?4) Follow-up with internal medicine clinic next week as scheduled on 3/23 at 2:45 pm ?5) Inject 15 units of Levemir into your skin at bedtime ?5) Please pick up your blood glucose meter and check your blood sugar 3 times daily. ? ?Take care,  ?Dr. Linwood Dibbles, MD, MPH ? ?

## 2022-02-08 LAB — T3: T3, Total: 169 ng/dL (ref 71–180)

## 2022-02-09 LAB — CULTURE, BLOOD (ROUTINE X 2)
Culture: NO GROWTH
Culture: NO GROWTH
Special Requests: ADEQUATE

## 2022-02-10 ENCOUNTER — Telehealth: Payer: Self-pay

## 2022-02-10 NOTE — Telephone Encounter (Signed)
Pa for pt ( LEVEMIR ) came though on cover my meds was submitted with hospital notes and labs.Marland Kitchen awaiting approval or denial   ?

## 2022-02-10 NOTE — Telephone Encounter (Signed)
UPDATE:    [employer] is reviewing your PA request. Typically an electronic response will be received within 24-72 hours.  

## 2022-02-10 NOTE — Telephone Encounter (Signed)
DECISION : ? ? ? DENIED : but there is no clear reasons on why the medication was denied  ? ? ? ? ?Dear Penny Tanner, ?We have reviewed a prior authorization request for the member listed on this letter. Here is a summary ?of the decision and the next steps to take. ?What is a prior authorization? ?A prior authorization is an approval from the pharmacy benefit manager or plan to cover the cost of a ?medication. ?Why does my plan want me to get a prior authorization? ?A Safety: You may be taking the medication for a different condition than it was designed for. ?A Effectiveness: Your medication may not work as intended with other medications you take. ?A Cost control: Your plan may cover a lower-cost option that works the same way. ?SUMMARY OF PRIOR AUTHORIZATION DECISION ?DECISION: DENIED ?You do not meet the clinical requirements for this medication. ?REASON: This decision is based on your plan's drug coverage policy for this medication. ?Please see the DECISION NOTES AND DETAILS section for more ?information. ?DRUG NAME: Levemir Inj Flexpen ?Penny Tanner ?PATIENT INFO: Member ID: [id number] ?Case ID: [id number] ?PROVIDER: Sharrell Ku ?[id number] ?Questions? ?We're here to help. ?Page 2 of 9 ?Visit [email or web address] or call the ?number on your member ID card. ?Case number: [id number] ?Talk with your provider about one of these options: ?1. Your doctor can ask for a reconsideration of the decision. ?2. Switch to another medication that's covered by the plan. ?NEXT STEPS: 3. You or your provider can appeal this decision. See APPEALS section of ?this notice. ?4. You can ask for an external review of this decision. See OTHER ?RESOURCES section of this letter ? ? ? ? ( COPY PLACED TO BE SCANNED TO CHART ALSO )  ? ?

## 2022-02-12 ENCOUNTER — Other Ambulatory Visit: Payer: Self-pay

## 2022-02-12 ENCOUNTER — Encounter: Payer: Self-pay | Admitting: Internal Medicine

## 2022-02-12 ENCOUNTER — Ambulatory Visit: Payer: BC Managed Care – PPO | Admitting: Internal Medicine

## 2022-02-12 VITALS — BP 131/61 | HR 76 | Temp 98.1°F | Ht 69.0 in | Wt 243.8 lb

## 2022-02-12 DIAGNOSIS — E0590 Thyrotoxicosis, unspecified without thyrotoxic crisis or storm: Secondary | ICD-10-CM

## 2022-02-12 DIAGNOSIS — L732 Hidradenitis suppurativa: Secondary | ICD-10-CM | POA: Diagnosis not present

## 2022-02-12 DIAGNOSIS — E1165 Type 2 diabetes mellitus with hyperglycemia: Secondary | ICD-10-CM | POA: Diagnosis not present

## 2022-02-12 DIAGNOSIS — Z0000 Encounter for general adult medical examination without abnormal findings: Secondary | ICD-10-CM

## 2022-02-12 MED ORDER — METFORMIN HCL 1000 MG PO TABS: 1000.0000 mg | ORAL_TABLET | Freq: Two times a day (BID) | ORAL | 2 refills | Status: AC

## 2022-02-12 NOTE — Patient Instructions (Addendum)
Dear Mrs. Valorie Roosevelt, ? ?Thank you for trusting [place] with your care today. ? ?We discussed your thyroid, diabetes, and hydradenitis suppurativa.  ? ?For your thyroid, we will recheck some lab work. Please continue to take the methimazole and propranolol. Please call your pharmacy to get your prescriptions refilled. ? ?For your diabetes, we will stop your insulin. We will prescribe you metformin 1000mg  to take twice daily. Please continue to monitor your diet and exercise as you are able. Please check your blood sugar daily. We would like to see you back in 1 month to evaluate your blood sugar and make medication adjustments at that time. We may also start additional medications at that time.  ? ?For your HS, please ensure that you follow up with the general surgeons. Metformin can also help control HS.  ? ?We will see you back in 1 month.  ?

## 2022-02-12 NOTE — Progress Notes (Signed)
? ?  CC: hfu ? ?HPI:Ms.AKSHITA ITALIANO is a 36 y.o. female who presents for evaluation of thyrotoxicosis. Please see individual problem based A/P for details. ? ? ?Depression, PHQ-9: ?Based on the patients  score we have . ? ?Past Medical History:  ?Diagnosis Date  ? DM (diabetes mellitus), type 2 (HCC)   ? Hyperthyroidism   ? PCOS (polycystic ovarian syndrome)   ? ?Review of Systems:   ?Review of Systems  ?Constitutional:  Positive for chills and malaise/fatigue.  ?HENT: Negative.    ?Eyes: Negative.   ?Respiratory: Negative.    ?Cardiovascular: Negative.   ?Gastrointestinal: Negative.   ?Genitourinary: Negative.   ?Musculoskeletal: Negative.   ?Skin: Negative.   ?Neurological: Negative.   ?Psychiatric/Behavioral: Negative.     ? ?Physical Exam: ?Vitals:  ? 02/12/22 1443 02/12/22 1551  ?BP: (!) 142/65 131/61  ?Pulse: 90 76  ?Temp: 98.1 ?F (36.7 ?C)   ?TempSrc: Oral   ?SpO2: 98%   ?Weight: 243 lb 12.8 oz (110.6 kg)   ?Height: 5\' 9"  (1.753 m)   ? ? ? ? ?General: AAOX3, NAD ?HEENT: Conjunctiva nl , antiicteric sclerae, moist mucous membranes, no exudate or erythema. Diffusely enlarged thyroid,  hirsutism ?Cardiovascular: Normal rate, regular rhythm.  No murmurs, rubs, or gallops ?Pulmonary : Equal breath sounds, No wheezes, rales, or rhonchi ?Abdominal: soft, nontender,  bowel sounds present. No drainage umbilicus but mild induration. Evidence of scarring in axillae. ?Ext: No edema in lower extremities, no tenderness to palpation of lower extremities.  ? ?Assessment & Plan:  ? ?See Encounters Tab for problem based charting. ? ?Patient discussed with Dr. ? ?

## 2022-02-14 LAB — T4, FREE: Free T4: 2.17 ng/dL — ABNORMAL HIGH (ref 0.82–1.77)

## 2022-02-14 LAB — THYROTROPIN RECEPTOR AUTOABS: Thyrotropin Receptor Ab: 2.1 IU/L — ABNORMAL HIGH (ref 0.00–1.75)

## 2022-02-14 LAB — TSH: TSH: <0.005 u[IU]/mL — ABNORMAL LOW (ref 0.450–4.500)

## 2022-02-14 LAB — T3: T3, Total: 200 ng/dL — ABNORMAL HIGH (ref 71–180)

## 2022-02-14 LAB — THYROID STIMULATING IMMUNOGLOBULIN: Thyroid Stim Immunoglobulin: 1.46 IU/L — ABNORMAL HIGH (ref 0.00–0.55)

## 2022-02-15 ENCOUNTER — Encounter: Payer: Self-pay | Admitting: Internal Medicine

## 2022-02-15 DIAGNOSIS — Z0000 Encounter for general adult medical examination without abnormal findings: Secondary | ICD-10-CM | POA: Insufficient documentation

## 2022-02-15 DIAGNOSIS — L732 Hidradenitis suppurativa: Secondary | ICD-10-CM | POA: Insufficient documentation

## 2022-02-15 NOTE — Assessment & Plan Note (Addendum)
Patient noted to have uncontrolled DM during hospitalization. A1c elevated at 11.7. She was started on levemir 15 units at bedtime which was continued on discharge. She has not been taking insulin since d/c due to cost as insurance would not cover it. Asymptomatic today. ?Patient reports taking metformin in the past, initially had GI upset, but this resolved with time and she reports being able to tolerate medication. ?Will discontinue insulin and start metformin 1000mg  BID for added benefit to HS.  ?Patient may also benefit from GLP1 for added benefit of weight loss if metformin does not sufficiently control DM.  ?Will need ophtho referral and urine micro at follow up. ? ?

## 2022-02-15 NOTE — Assessment & Plan Note (Addendum)
Reports she follows with Dr. Leim Fabry on [place]. States last pap 3 weeks ago. ?

## 2022-02-15 NOTE — Assessment & Plan Note (Signed)
Patient recently completed course of abx during hospitalization for acute thyrotoxicosis. Reports lesions underneath breasts, umbilical lesions, and old axillary lesions. She reports breast lesions no longer draining. Has noted some crusting around umbilical lesions. No fever, chills, or pain. ?Lesions appear stable at this point. NO active drainage. Mild erythema and induration of umbilical lesion. ?Will initiate patient on metformin given concomitant diabetes. Furthermore, she has an appointment with [place] [facility]; encouraged her to keep appointment with them.   ?

## 2022-02-15 NOTE — Assessment & Plan Note (Signed)
Patient following up after recent hospitalization for acute thyrotoxicosis. Fever, muscle aches, chills, tachycardia, and tachypnea, elevated T3 and T4 on admission. Symptoms had completely resolved on discharge after initiation of methimazole and propranolol.  ?Thyroid US showed markedly heterogenous mildly enlarged thyroid gland without discreet nodule.  ?Patient has not been taking her methimazole as she believed she had run out. She has continued taking propranolol. Endorsing tiredness and feeling cold while in clinic, but no other symptoms.  ?Vital signs appear stable. Mildly enlarged non-tender thyroid without obvious nodules. ?Given recent pregnancy, now approximately 3 months post-partum, Thyroid stimulating immunoglobulin and thyroid receptor antibodies checked due to concern for post-partum hyperthyroidism. 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 ?TSH, T3, and T4 remain elevated, likely related to methimazole non-adherence. Patient encouraged to refill her methimazole and continue taking propranolol. Will plan for close follow up in 1 month to reassess.  ?

## 2022-02-19 NOTE — Progress Notes (Signed)
Internal Medicine Clinic Attending  Case discussed with Dr. Gawaluck  at the time of the visit.  We reviewed the resident's history and exam and pertinent patient test results.  I agree with the assessment, diagnosis, and plan of care documented in the resident's note.  

## 2022-03-10 ENCOUNTER — Encounter: Payer: BC Managed Care – PPO | Admitting: Internal Medicine

## 2022-05-16 ENCOUNTER — Emergency Department (HOSPITAL_COMMUNITY)
Admission: EM | Admit: 2022-05-16 | Discharge: 2022-05-16 | Disposition: A | Discharge status: Home / Self Care | Payer: Self-pay | Attending: Emergency Medicine | Admitting: Emergency Medicine

## 2022-05-16 ENCOUNTER — Emergency Department (HOSPITAL_COMMUNITY): Payer: Self-pay

## 2022-05-16 ENCOUNTER — Other Ambulatory Visit: Payer: Self-pay

## 2022-05-16 DIAGNOSIS — N9489 Other specified conditions associated with female genital organs and menstrual cycle: Secondary | ICD-10-CM | POA: Insufficient documentation

## 2022-05-16 DIAGNOSIS — E039 Hypothyroidism, unspecified: Secondary | ICD-10-CM | POA: Insufficient documentation

## 2022-05-16 DIAGNOSIS — J3489 Other specified disorders of nose and nasal sinuses: Secondary | ICD-10-CM | POA: Insufficient documentation

## 2022-05-16 DIAGNOSIS — R0981 Nasal congestion: Secondary | ICD-10-CM | POA: Insufficient documentation

## 2022-05-16 DIAGNOSIS — J029 Acute pharyngitis, unspecified: Secondary | ICD-10-CM | POA: Insufficient documentation

## 2022-05-16 DIAGNOSIS — R059 Cough, unspecified: Secondary | ICD-10-CM | POA: Insufficient documentation

## 2022-05-16 DIAGNOSIS — E119 Type 2 diabetes mellitus without complications: Secondary | ICD-10-CM | POA: Insufficient documentation

## 2022-05-16 DIAGNOSIS — Z7984 Long term (current) use of oral hypoglycemic drugs: Secondary | ICD-10-CM | POA: Insufficient documentation

## 2022-05-16 DIAGNOSIS — Z20822 Contact with and (suspected) exposure to covid-19: Secondary | ICD-10-CM | POA: Insufficient documentation

## 2022-05-16 LAB — RESP PANEL BY RT-PCR (FLU A&B, COVID) ARPGX2
Influenza A by PCR: NEGATIVE
Influenza B by PCR: NEGATIVE
SARS Coronavirus 2 by RT PCR: NEGATIVE

## 2022-05-16 LAB — CBC WITH DIFFERENTIAL/PLATELET
Abs Immature Granulocytes: 0.11 10*3/uL — ABNORMAL HIGH (ref 0.00–0.07)
Basophils Absolute: 0 10*3/uL (ref 0.0–0.1)
Basophils Relative: 0 %
Eosinophils Absolute: 0.4 10*3/uL (ref 0.0–0.5)
Eosinophils Relative: 3 %
HCT: 36.3 % (ref 36.0–46.0)
Hemoglobin: 11.2 g/dL — ABNORMAL LOW (ref 12.0–15.0)
Immature Granulocytes: 1 %
Lymphocytes Relative: 13 %
Lymphs Abs: 1.9 10*3/uL (ref 0.7–4.0)
MCH: 25.6 pg — ABNORMAL LOW (ref 26.0–34.0)
MCHC: 30.9 g/dL (ref 30.0–36.0)
MCV: 83.1 fL (ref 80.0–100.0)
Monocytes Absolute: 0.7 10*3/uL (ref 0.1–1.0)
Monocytes Relative: 5 %
Neutro Abs: 11.5 10*3/uL — ABNORMAL HIGH (ref 1.7–7.7)
Neutrophils Relative %: 78 %
Platelets: 354 10*3/uL (ref 150–400)
RBC: 4.37 MIL/uL (ref 3.87–5.11)
RDW: 14.5 % (ref 11.5–15.5)
WBC: 14.7 10*3/uL — ABNORMAL HIGH (ref 4.0–10.5)
nRBC: 0 % (ref 0.0–0.2)

## 2022-05-16 LAB — COMPREHENSIVE METABOLIC PANEL
ALT: 24 U/L (ref 0–44)
AST: 21 U/L (ref 15–41)
Albumin: 3.3 g/dL — ABNORMAL LOW (ref 3.5–5.0)
Alkaline Phosphatase: 146 U/L — ABNORMAL HIGH (ref 38–126)
Anion gap: 12 (ref 5–15)
BUN: 8 mg/dL (ref 6–20)
CO2: 25 mmol/L (ref 22–32)
Calcium: 8.9 mg/dL (ref 8.9–10.3)
Chloride: 98 mmol/L (ref 98–111)
Creatinine, Ser: 0.77 mg/dL (ref 0.44–1.00)
GFR, Estimated: >60 mL/min (ref >60)
Glucose, Bld: 90 mg/dL (ref 70–99)
Potassium: 3.5 mmol/L (ref 3.5–5.1)
Sodium: 135 mmol/L (ref 135–145)
Total Bilirubin: 0.9 mg/dL (ref 0.3–1.2)
Total Protein: 8.4 g/dL — ABNORMAL HIGH (ref 6.5–8.1)

## 2022-05-16 LAB — I-STAT BETA HCG BLOOD, ED (MC, WL, AP ONLY): I-stat hCG, quantitative: <5 m[IU]/mL (ref <5)

## 2022-05-16 MED ORDER — ACETAMINOPHEN 10 MG/ML IV SOLN
1000.0000 mg | Freq: Four times a day (QID) | INTRAVENOUS | Status: DC
  Administered 2022-05-16: 1000 mg via INTRAVENOUS
  Filled 2022-05-16 (×3): qty 100

## 2022-05-16 MED ORDER — LIDOCAINE VISCOUS HCL 2 % MT SOLN: 15.0000 mL | OROMUCOSAL | 0 refills | Status: AC | PRN

## 2022-05-16 MED ORDER — AMOXICILLIN-POT CLAVULANATE 875-125 MG PO TABS
1.0000 | ORAL_TABLET | Freq: Once | ORAL | Status: AC
  Administered 2022-05-16: 1 via ORAL
  Filled 2022-05-16: qty 1

## 2022-05-16 MED ORDER — LIDOCAINE VISCOUS HCL 2 % MT SOLN: 15.0000 mL | OROMUCOSAL | 0 refills | Status: DC | PRN

## 2022-05-16 MED ORDER — IOHEXOL 300 MG/ML  SOLN
75.0000 mL | Freq: Once | INTRAMUSCULAR | Status: AC | PRN
  Administered 2022-05-16: 75 mL via INTRAVENOUS

## 2022-05-16 MED ORDER — LIDOCAINE VISCOUS HCL 2 % MT SOLN
15.0000 mL | Freq: Once | OROMUCOSAL | Status: AC
  Administered 2022-05-16: 15 mL via OROMUCOSAL
  Filled 2022-05-16: qty 15

## 2022-05-16 MED ORDER — AMOXICILLIN-POT CLAVULANATE 875-125 MG PO TABS: 1.0000 | ORAL_TABLET | Freq: Two times a day (BID) | ORAL | 0 refills | Status: AC

## 2022-05-16 MED ORDER — AMOXICILLIN-POT CLAVULANATE 875-125 MG PO TABS: 1.0000 | ORAL_TABLET | Freq: Two times a day (BID) | ORAL | 0 refills | Status: DC

## 2022-05-16 MED ORDER — SODIUM CHLORIDE 0.9 % IV BOLUS
1000.0000 mL | Freq: Once | INTRAVENOUS | Status: AC
  Administered 2022-05-16: 1000 mL via INTRAVENOUS

## 2022-05-16 NOTE — ED Triage Notes (Signed)
Pt c/o sore throat, cough, nasal congestion, sob x 1 week.

## 2022-10-12 ENCOUNTER — Emergency Department (HOSPITAL_BASED_OUTPATIENT_CLINIC_OR_DEPARTMENT_OTHER): Payer: Self-pay

## 2022-10-12 ENCOUNTER — Encounter (HOSPITAL_BASED_OUTPATIENT_CLINIC_OR_DEPARTMENT_OTHER): Payer: Self-pay

## 2022-10-12 ENCOUNTER — Other Ambulatory Visit: Payer: Self-pay

## 2022-10-12 ENCOUNTER — Emergency Department (HOSPITAL_BASED_OUTPATIENT_CLINIC_OR_DEPARTMENT_OTHER)
Admission: EM | Admit: 2022-10-12 | Discharge: 2022-10-12 | Disposition: A | Discharge status: Home / Self Care | Payer: Self-pay | Attending: Emergency Medicine | Admitting: Emergency Medicine

## 2022-10-12 DIAGNOSIS — D649 Anemia, unspecified: Secondary | ICD-10-CM | POA: Insufficient documentation

## 2022-10-12 DIAGNOSIS — R1010 Upper abdominal pain, unspecified: Secondary | ICD-10-CM | POA: Diagnosis present

## 2022-10-12 DIAGNOSIS — N9489 Other specified conditions associated with female genital organs and menstrual cycle: Secondary | ICD-10-CM | POA: Insufficient documentation

## 2022-10-12 DIAGNOSIS — D72829 Elevated white blood cell count, unspecified: Secondary | ICD-10-CM | POA: Insufficient documentation

## 2022-10-12 DIAGNOSIS — E039 Hypothyroidism, unspecified: Secondary | ICD-10-CM | POA: Insufficient documentation

## 2022-10-12 DIAGNOSIS — K2900 Acute gastritis without bleeding: Secondary | ICD-10-CM | POA: Diagnosis not present

## 2022-10-12 DIAGNOSIS — Z7984 Long term (current) use of oral hypoglycemic drugs: Secondary | ICD-10-CM | POA: Insufficient documentation

## 2022-10-12 DIAGNOSIS — Z20822 Contact with and (suspected) exposure to covid-19: Secondary | ICD-10-CM | POA: Diagnosis not present

## 2022-10-12 DIAGNOSIS — E119 Type 2 diabetes mellitus without complications: Secondary | ICD-10-CM | POA: Diagnosis not present

## 2022-10-12 DIAGNOSIS — R112 Nausea with vomiting, unspecified: Secondary | ICD-10-CM

## 2022-10-12 DIAGNOSIS — R197 Diarrhea, unspecified: Secondary | ICD-10-CM

## 2022-10-12 DIAGNOSIS — R0789 Other chest pain: Secondary | ICD-10-CM

## 2022-10-12 LAB — CBC
HCT: 35 % — ABNORMAL LOW (ref 36.0–46.0)
Hemoglobin: 11.1 g/dL — ABNORMAL LOW (ref 12.0–15.0)
MCH: 26.1 pg (ref 26.0–34.0)
MCHC: 31.7 g/dL (ref 30.0–36.0)
MCV: 82.2 fL (ref 80.0–100.0)
Platelets: 368 10*3/uL (ref 150–400)
RBC: 4.26 MIL/uL (ref 3.87–5.11)
RDW: 13.9 % (ref 11.5–15.5)
WBC: 12.1 10*3/uL — ABNORMAL HIGH (ref 4.0–10.5)
nRBC: 0 % (ref 0.0–0.2)

## 2022-10-12 LAB — HCG, SERUM, QUALITATIVE: Preg, Serum: NEGATIVE

## 2022-10-12 LAB — COMPREHENSIVE METABOLIC PANEL
ALT: 9 U/L (ref 0–44)
AST: 12 U/L — ABNORMAL LOW (ref 15–41)
Albumin: 4.2 g/dL (ref 3.5–5.0)
Alkaline Phosphatase: 85 U/L (ref 38–126)
Anion gap: 10 (ref 5–15)
BUN: 8 mg/dL (ref 6–20)
CO2: 24 mmol/L (ref 22–32)
Calcium: 9.3 mg/dL (ref 8.9–10.3)
Chloride: 100 mmol/L (ref 98–111)
Creatinine, Ser: 0.69 mg/dL (ref 0.44–1.00)
GFR, Estimated: >60 mL/min (ref >60)
Glucose, Bld: 152 mg/dL — ABNORMAL HIGH (ref 70–99)
Potassium: 3.8 mmol/L (ref 3.5–5.1)
Sodium: 134 mmol/L — ABNORMAL LOW (ref 135–145)
Total Bilirubin: 0.4 mg/dL (ref 0.3–1.2)
Total Protein: 8.7 g/dL — ABNORMAL HIGH (ref 6.5–8.1)

## 2022-10-12 LAB — URINALYSIS, ROUTINE W REFLEX MICROSCOPIC
Bilirubin Urine: NEGATIVE
Glucose, UA: NEGATIVE mg/dL
Hgb urine dipstick: NEGATIVE
Ketones, ur: NEGATIVE mg/dL
Leukocytes,Ua: NEGATIVE
Nitrite: NEGATIVE
Protein, ur: NEGATIVE mg/dL
Specific Gravity, Urine: 1.006 (ref 1.005–1.030)
pH: 6.5 (ref 5.0–8.0)

## 2022-10-12 LAB — TROPONIN I (HIGH SENSITIVITY): Troponin I (High Sensitivity): <2 ng/L (ref <18)

## 2022-10-12 LAB — RESP PANEL BY RT-PCR (FLU A&B, COVID) ARPGX2
Influenza A by PCR: NEGATIVE
Influenza B by PCR: NEGATIVE
SARS Coronavirus 2 by RT PCR: NEGATIVE

## 2022-10-12 LAB — LIPASE, BLOOD: Lipase: 36 U/L (ref 11–51)

## 2022-10-12 MED ORDER — ONDANSETRON HCL 4 MG/2ML IJ SOLN
4.0000 mg | Freq: Once | INTRAMUSCULAR | Status: AC
Start: 2022-10-12 — End: 2022-10-12
  Administered 2022-10-12: 4 mg via INTRAVENOUS
  Filled 2022-10-12: qty 2

## 2022-10-12 MED ORDER — ONDANSETRON 8 MG PO TBDP: 8.0000 mg | ORAL_TABLET | Freq: Three times a day (TID) | ORAL | 0 refills | Status: DC | PRN

## 2022-10-12 MED ORDER — LIDOCAINE VISCOUS HCL 2 % MT SOLN
15.0000 mL | Freq: Once | OROMUCOSAL | Status: AC
  Administered 2022-10-12: 15 mL via ORAL
  Filled 2022-10-12: qty 15

## 2022-10-12 MED ORDER — PANTOPRAZOLE SODIUM 20 MG PO TBEC: 20.0000 mg | DELAYED_RELEASE_TABLET | Freq: Every day | ORAL | 1 refills | Status: DC

## 2022-10-12 MED ORDER — PANTOPRAZOLE SODIUM 20 MG PO TBEC: 20.0000 mg | DELAYED_RELEASE_TABLET | Freq: Every day | ORAL | 1 refills | Status: AC

## 2022-10-12 MED ORDER — ALUM & MAG HYDROXIDE-SIMETH 200-200-20 MG/5ML PO SUSP
30.0000 mL | Freq: Once | ORAL | Status: AC
  Administered 2022-10-12: 30 mL via ORAL
  Filled 2022-10-12: qty 30

## 2022-10-12 MED ORDER — SODIUM CHLORIDE 0.9 % IV BOLUS
1000.0000 mL | Freq: Once | INTRAVENOUS | Status: AC
  Administered 2022-10-12: 1000 mL via INTRAVENOUS

## 2022-10-12 NOTE — ED Provider Notes (Signed)
[facility]ERGENC[facility] DEPT Provider Note   CSN: [id number] Arrival date & time: 10/12/22  1949     History  Chief Complaint  Patient presents with   Emesis    Penny Tanner is a 36 y.o. female.   Emesis    Patient with medical history of diabetes, hypothyroid, PCOS presents to the emergency department due to chest pain, upper abdominal pain, nausea and vomiting x1 day.  Started acutely this morning, chest pain is intermittent feels like a throbbing pain.  To the left side of her chest without radiation elsewhere.  She started having upper abdominal pain which feels like a burning sensation.  She has had multiple episodes of emesis without hematemesis, also endorses diarrhea.  Denies any recent travel or antibiotic use.  She does take high amounts of anti-inflammatories due to frequent headaches, denies any history of gastric ulcers or any blood in the stool.  Pain history of cyst removal but no other abdominal surgeries.  Does not drink any alcohol.  Denies any history of ACS, PE.  No dysuria or hematuria.   Home Medications Prior to Admission medications   Medication Sig Start Date End Date Taking? Authorizing Provider  blood glucose meter kit and supplies Dispense based on patient and insurance preference. Use up to four times daily as directed. (FOR ICD-10 E10.9, E11.9). 02/07/22   Lacinda Axon, MD  lidocaine (XYLOCAINE) 2 % solution Use as directed 15 mLs in the mouth or throat as needed for mouth pain. 05/16/22   Loni Beckwith, PA-C  metFORMIN (GLUCOPHAGE) 1000 MG tablet Take 1 tablet (1,000 mg total) by mouth 2 (two) times daily with a meal. 02/12/22 05/13/22  Delene Ruffini, MD  methimazole (TAPAZOLE) 10 MG tablet Take 2 tablets (20 mg total) by mouth 2 (two) times daily. 02/07/22   Lacinda Axon, MD  ondansetron (ZOFRAN-ODT) 8 MG disintegrating tablet Take 1 tablet (8 mg total) by mouth every 8 (eight) hours as needed for nausea or vomiting.  10/12/22   Sherrill Raring, PA-C  pantoprazole (PROTONIX) 20 MG tablet Take 1 tablet (20 mg total) by mouth daily. 10/12/22   Sherrill Raring, PA-C  propranolol (INDERAL) 60 MG tablet Take 1 tablet (60 mg total) by mouth every 4 (four) hours. 02/07/22   Lacinda Axon, MD      Allergies    Patient has no known allergies.    Review of Systems   Review of Systems  Gastrointestinal:  Positive for vomiting.    Physical Exam Updated Vital Signs BP 105/64 (BP Location: Left Arm)   Pulse 81   Temp 97.9 F (36.6 C) (Oral)   Resp 16   Ht _0  (1.753 m)   Wt 127 kg   LMP 10/06/2022   SpO2 97%   BMI 41.35 kg/m  Physical Exam Vitals and nursing note reviewed. Exam conducted with a chaperone present.  Constitutional:      Appearance: Normal appearance.  HENT:     Head: Normocephalic and atraumatic.  Eyes:     General: No scleral icterus.       Right eye: No discharge.        Left eye: No discharge.     Extraocular Movements: Extraocular movements intact.     Pupils: Pupils are equal, round, and reactive to light.  Cardiovascular:     Rate and Rhythm: Normal rate and regular rhythm.     Pulses: Normal pulses.     Heart sounds: Normal heart sounds. No  murmur heard.    No friction rub. No gallop.     Comments: Upper and lower extremity pulses are symmetric bilaterally. Pulmonary:     Effort: Pulmonary effort is normal. No respiratory distress.     Breath sounds: Normal breath sounds.  Abdominal:     General: Abdomen is flat. Bowel sounds are normal. There is no distension.     Palpations: Abdomen is soft.     Tenderness: There is abdominal tenderness.     Comments: Epigastric tenderness without rigidity or guarding.  Abdomen is soft.  Skin:    General: Skin is warm and dry.     Coloration: Skin is not jaundiced.  Neurological:     Mental Status: She is alert. Mental status is at baseline.     Coordination: Coordination normal.     ED Results / Procedures / Treatments    Labs (all labs ordered are listed, but only abnormal results are displayed) Labs Reviewed  COMPREHENSIVE METABOLIC PANEL - Abnormal; Notable for the following components:      Result Value   Sodium 134 (*)    Glucose, Bld 152 (*)    Total Protein 8.7 (*)    AST 12 (*)    All other components within normal limits  CBC - Abnormal; Notable for the following components:   WBC 12.1 (*)    Hemoglobin 11.1 (*)    HCT 35.0 (*)    All other components within normal limits  URINALYSIS, ROUTINE W REFLEX MICROSCOPIC - Abnormal; Notable for the following components:   Color, Urine COLORLESS (*)    All other components within normal limits  RESP PANEL BY RT-PCR (FLU A&B, COVID) ARPGX2  LIPASE, BLOOD  HCG, SERUM, QUALITATIVE  TROPONIN I (HIGH SENSITIVITY)  TROPONIN I (HIGH SENSITIVITY)    EKG EKG Interpretation  Date/Time:  Monday October 12 2022 19:57:54 EST Ventricular Rate:  101 PR Interval:  172 QRS Duration: 86 QT Interval:  358 QTC Calculation: 464 R Axis:   63 Text Interpretation: Sinus tachycardia Otherwise normal ECG When compared with ECG of 16-May-2022 15:55, Non-specific change in ST segment in Inferior leads Nonspecific T wave abnormality, improved in Anterolateral leads Confirmed by Regan Lemming ([id number]) on 10/12/2022 8:51:06 PM  Radiology DG Chest Portable 1 View  Result Date: 10/12/2022 CLINICAL DATA:  Chest pain, vomiting, and diarrhea. EXAM: PORTABLE CHEST 1 VIEW COMPARISON:  05/16/2022. FINDINGS: The heart size and mediastinal contours are within normal limits. Lung volumes are low and there is mild atelectasis at the left lung base. No effusion or pneumothorax. No acute osseous abnormality. IMPRESSION: No active disease. Electronically Signed   By: Brett Fairy M.D.   On: 10/12/2022 21:50    Procedures Procedures    Medications Ordered in ED Medications  sodium chloride 0.9 % bolus 1,000 mL (0 mLs Intravenous Stopped 10/12/22 2224)  alum & mag  hydroxide-simeth (MAALOX/MYLANTA) 200-200-20 MG/5ML suspension 30 mL (30 mLs Oral Given 10/12/22 2114)    And  lidocaine (XYLOCAINE) 2 % viscous mouth solution 15 mL (15 mLs Oral Given 10/12/22 2114)  ondansetron (ZOFRAN) injection 4 mg (4 mg Intravenous Given 10/12/22 2114)    ED Course/ Medical Decision Making/ A&P                           Medical Decision Making Amount and/or Complexity of Data Reviewed Labs: ordered. Radiology: ordered.  Risk OTC drugs. Prescription drug management.   Patient presents due to nausea,  vomiting, diarrhea, abdominal pain and chest pain.  Differential is broad and includes not limited to ACS, PE, pneumonia, dissection, AAA, gastritis, perforated gastric ulcer, UTI, pyonephritis, COVID, flu, pneumonia, AKI.  I ordered, viewed and interpreted laboratory work-up. CBC with very mild leukocytosis 12.1.  Slightly anemia with hemoglobin 11.1. Patient is not pregnant, not ectopic.  Lipase within normal limits, not consistent with pancreatitis. CT without gross electrolyte derangement, AKI or transaminitis. UA without signs of underlying infection. COVID and flu negative. Troponin is less than 2.  EKG with sinus rhythm, some inferior T wave inversions but not grossly changed compared to previous.  Chest x-ray negative for acute process.  I reevaluated patient who is feeling improved after liter fluid bolus, Maalox and Zofran.  Tolerating p.o.  Patient has been on cardiac monitoring in sinus rhythm.  On Reevaluation she is feeling improved.  I considered PE but patient is PERC negative.  No signs of pneumonia on chest x-ray.  I considered getting CT abdomen but repeat abdominal exam benign, pain is epigastric and I do not really think this is consistent with dissection.  I also considered AAA but think unlikely as well.  Given negative troponin and no ischemic changes compared to previous on EKG in the setting of vomiting and diarrhea and abdominal pain I  do not think this is ACS.  Gastritis is highest on the differential given significant mount of anti-inflammatory use with the nausea and vomiting.  Given the Setting of diarrhea is also possible she has an underlying viral illness especially given her temperature is elevated 100.2.   We will have patient continue taking analgesics at home, avoiding anti-inflammatories and following up closely with PCP.  If she has return of persistent chest pain, severe abdominal pain, inability eat or drink without vomiting she will return back to the ED.  At this time I do feel patient is stable and appropriate for close outpatient follow-up.        Final Clinical Impression(s) / ED Diagnoses Final diagnoses:  Acute gastritis without hemorrhage, unspecified gastritis type  Atypical chest pain  Nausea and vomiting, unspecified vomiting type  Diarrhea, unspecified type    Rx / DC Orders ED Discharge Orders          Ordered    ondansetron (ZOFRAN-ODT) 8 MG disintegrating tablet  Every 8 hours PRN,   Status:  Discontinued        10/12/22 2302    pantoprazole (PROTONIX) 20 MG tablet  Daily,   Status:  Discontinued        10/12/22 2302    ondansetron (ZOFRAN-ODT) 8 MG disintegrating tablet  Every 8 hours PRN        10/12/22 2310    pantoprazole (PROTONIX) 20 MG tablet  Daily        10/12/22 2310              Sherrill Raring, PA-C 10/12/22 2330    Regan Lemming, MD 10/13/22 1309

## 2022-10-12 NOTE — ED Triage Notes (Signed)
C/O CP, vomiting and diarrhea started this am

## 2022-10-12 NOTE — Discharge Instructions (Addendum)
You are seen today in the emergency department due to nausea, vomiting, diarrhea, abdominal pain chest pain.  I suspect your symptoms are most likely due to gastritis due to irritation of the stomach lining.  I also also he may have a slight viral illness which should pass on its own in the next 24 to 48 hours.  Please avoid any anti-inflammatory medicine and take Tylenol if you are having headaches.  You take Zofran every 8 hours as needed for nausea.  Drink plenty of fluids, follow-up with your primary in the next 2 days for reevaluation.  Patient having persistent chest pain, persistent abdominal pain or new or concerning symptoms you should return back to the ED for additional evaluation.

## 2023-09-06 ENCOUNTER — Encounter: Payer: Self-pay | Admitting: Student

## 2023-09-06 NOTE — Progress Notes (Deleted)
   Established Patient Office Visit  Subjective   Patient ID: Penny Tanner, female    DOB: 28-Mar-1986  Age: 36 y.o. MRN: [id number]  No chief complaint on file.   HPI  This is a pleasant 37 year old female living with a history stated below and presents today for A1c check. Please see problem based assessment and plan for additional details.   {History (Optional):23778}  ROS    Objective:     There were no vitals taken for this visit. {Vitals History (Optional):23777}  Physical Exam   No results found for any visits on 09/06/23.  {Labs (Optional):23779}  The ASCVD Risk score (Arnett DK, et al., 2019) failed to calculate for the following reasons:   The 2019 ASCVD risk score is only valid for ages 26 to 62    Assessment & Plan:   Problem List Items Addressed This Visit   None   No follow-ups on file.    Jeral Pinch, DO

## 2024-04-19 ENCOUNTER — Emergency Department (HOSPITAL_BASED_OUTPATIENT_CLINIC_OR_DEPARTMENT_OTHER)
Admission: EM | Admit: 2024-04-19 | Discharge: 2024-04-19 | Disposition: A | Discharge status: Home / Self Care | Attending: Emergency Medicine | Admitting: Emergency Medicine

## 2024-04-19 ENCOUNTER — Encounter (HOSPITAL_BASED_OUTPATIENT_CLINIC_OR_DEPARTMENT_OTHER): Payer: Self-pay | Admitting: Emergency Medicine

## 2024-04-19 ENCOUNTER — Other Ambulatory Visit: Payer: Self-pay

## 2024-04-19 ENCOUNTER — Other Ambulatory Visit (HOSPITAL_BASED_OUTPATIENT_CLINIC_OR_DEPARTMENT_OTHER): Payer: Self-pay

## 2024-04-19 DIAGNOSIS — L732 Hidradenitis suppurativa: Secondary | ICD-10-CM | POA: Insufficient documentation

## 2024-04-19 MED ORDER — OXYCODONE HCL 5 MG PO TABS
5.0000 mg | ORAL_TABLET | Freq: Once | ORAL | Status: AC
  Administered 2024-04-19: 5 mg via ORAL
  Filled 2024-04-19: qty 1

## 2024-04-19 MED ORDER — HYDROCODONE-ACETAMINOPHEN 5-325 MG PO TABS: 1.0000 | ORAL_TABLET | ORAL | 0 refills | Status: AC | PRN

## 2024-04-19 MED ORDER — CLINDAMYCIN HCL 300 MG PO CAPS
300.0000 mg | ORAL_CAPSULE | Freq: Four times a day (QID) | ORAL | 0 refills | Status: DC
  Filled 2024-04-19: qty 28, 7d supply, fill #0

## 2024-04-19 MED ORDER — CLINDAMYCIN HCL 300 MG PO CAPS: 300.0000 mg | ORAL_CAPSULE | Freq: Four times a day (QID) | ORAL | 0 refills | Status: AC

## 2024-04-19 MED ORDER — CLINDAMYCIN HCL 150 MG PO CAPS
300.0000 mg | ORAL_CAPSULE | Freq: Once | ORAL | Status: AC
  Administered 2024-04-19: 300 mg via ORAL
  Filled 2024-04-19: qty 2

## 2024-04-19 MED ORDER — HYDROCODONE-ACETAMINOPHEN 5-325 MG PO TABS
1.0000 | ORAL_TABLET | ORAL | 0 refills | Status: DC | PRN
  Filled 2024-04-19: qty 10, 2d supply, fill #0

## 2024-04-19 NOTE — ED Triage Notes (Signed)
 States boil under left arm. Painful to touch. Denies fevers.

## 2024-04-19 NOTE — ED Provider Notes (Signed)
 [place] EMERGENCY DEPARTMENT AT [facility] Provider Note   CSN: [id number] Arrival date & time: 04/19/24  1642     History  Chief Complaint  Patient presents with   Recurrent Skin Infections    Penny Tanner is a 38 y.o. female.  38 year old female with past medical history of hidradenitis presents with acutely worsening pain in her left axillary area since yesterday.  Denies drainage from the area or fever.  Has not been applying anything to the area recently.       Home Medications Prior to Admission medications   Medication Sig Start Date End Date Taking? Authorizing Provider  clindamycin  (CLEOCIN ) 300 MG capsule Take 1 capsule (300 mg total) by mouth 4 (four) times daily for 7 days. 04/19/24 04/26/24 Yes Darlis Eisenmenger, PA-C  HYDROcodone -acetaminophen  (NORCO/VICODIN) 5-325 MG tablet Take 1 tablet by mouth every 4 (four) hours as needed. 04/19/24  Yes Darlis Eisenmenger, PA-C  blood glucose meter kit and supplies Dispense based on patient and insurance preference. Use up to four times daily as directed. (FOR ICD-10 E10.9, E11.9). 02/07/22   Vita Grip, MD  lidocaine  (XYLOCAINE ) 2 % solution Use as directed 15 mLs in the mouth or throat as needed for mouth pain. 05/16/22   Marshal Skeens, PA-C  metFORMIN  (GLUCOPHAGE ) 1000 MG tablet Take 1 tablet (1,000 mg total) by mouth 2 (two) times daily with a meal. 02/12/22 05/13/22  Gawaluck, Greylon, MD  methimazole  (TAPAZOLE ) 10 MG tablet Take 2 tablets (20 mg total) by mouth 2 (two) times daily. 02/07/22   Vita Grip, MD  ondansetron  (ZOFRAN -ODT) 8 MG disintegrating tablet Take 1 tablet (8 mg total) by mouth every 8 (eight) hours as needed for nausea or vomiting. 10/12/22   Delray Fielding, PA-C  pantoprazole  (PROTONIX ) 20 MG tablet Take 1 tablet (20 mg total) by mouth daily. 10/12/22   Delray Fielding, PA-C  propranolol  (INDERAL ) 60 MG tablet Take 1 tablet (60 mg total) by mouth every 4 (four) hours. 02/07/22    Amponsah, Prosper M, MD      Allergies    Patient has no known allergies.    Review of Systems   Review of Systems Negative except as per HPI Physical Exam Updated Vital Signs BP (!) 153/81 (BP Location: Left Arm)   Pulse 84   Temp 98.7 F (37.1 C) (Oral)   Resp 18   SpO2 99%  Physical Exam Vitals and nursing note reviewed.  Constitutional:      General: She is not in acute distress.    Appearance: She is well-developed. She is not diaphoretic.  HENT:     Head: Normocephalic and atraumatic.  Pulmonary:     Effort: Pulmonary effort is normal.  Skin:    General: Skin is warm and dry.     Comments: Left axillary area diffusely tender, multiple tender nodules, no active drainage, no fluctuance   Neurological:     Mental Status: She is alert and oriented to person, place, and time.  Psychiatric:        Behavior: Behavior normal.     ED Results / Procedures / Treatments   Labs (all labs ordered are listed, but only abnormal results are displayed) Labs Reviewed - No data to display  EKG None  Radiology No results found.  Procedures Procedures    Medications Ordered in ED Medications  clindamycin  (CLEOCIN ) capsule 300 mg (has no administration in time range)  oxyCODONE  (Oxy IR/ROXICODONE ) immediate release tablet 5 mg (  has no administration in time range)    ED Course/ Medical Decision Making/ A&P                                 Medical Decision Making  38 year old female presents with complaint of pain in her left axilla, history of hidradenitis.  On exam, multiple tender nodules without any appreciable fluctuance, no active drainage.  Discussed complicated management with the patient.  At this time, recommend oral antibiotics and narcotic pain medication.  Offered topical antibiotic, patient states that she has tried this in the past without relief.  Unfortunately, do not have rheumatology or dermatology to refer patient to.  She does not have primary care but  plans to look for an in network provider.  Will provide referral to general surgery in the meantime.        Final Clinical Impression(s) / ED Diagnoses Final diagnoses:  Hidradenitis suppurativa    Rx / DC Orders ED Discharge Orders          Ordered    clindamycin  (CLEOCIN ) 300 MG capsule  4 times daily        04/19/24 1802    HYDROcodone -acetaminophen  (NORCO/VICODIN) 5-325 MG tablet  Every 4 hours PRN        04/19/24 1802              Darlis Eisenmenger, PA-C 04/19/24 1806    Lowery Rue, DO 04/19/24 1848

## 2024-04-19 NOTE — Discharge Instructions (Signed)
 Take antibiotics as prescribed and complete the full course.  Take Norco as needed as prescribed for pain.  Do not drive or operate machinery if you are taking this medication.  Follow-up with primary care as discussed.  May benefit from referral to dermatology or rheumatology.  In the meantime, follow-up with general surgery, call to schedule appointment.

## 2024-04-19 NOTE — ED Notes (Signed)
 Discharge instructions, follow up care, and prescriptions reviewed and explained, pt verbalized understanding and had no further questions.

## 2024-06-13 ENCOUNTER — Other Ambulatory Visit: Payer: Self-pay

## 2024-06-13 ENCOUNTER — Ambulatory Visit: Admitting: Student

## 2024-06-13 ENCOUNTER — Ambulatory Visit: Admitting: Family Medicine

## 2024-06-13 ENCOUNTER — Encounter: Payer: Self-pay | Admitting: Student

## 2024-06-13 VITALS — BP 118/69 | HR 91 | Temp 98.0°F | Ht 69.0 in | Wt 258.0 lb

## 2024-06-13 DIAGNOSIS — R399 Unspecified symptoms and signs involving the genitourinary system: Secondary | ICD-10-CM | POA: Diagnosis not present

## 2024-06-13 NOTE — Patient Instructions (Signed)
 Thank you so much for coming to the clinic today!   Attached is your paperwork. We would like to see you back in about a month or so to discuss healthcare maintenance and get you caught up with all the screening recommendations!  If you have any questions please feel free to the call the clinic at anytime at [phone]. It was a pleasure seeing you!  Best, Dr. Levaeh Vice

## 2024-06-13 NOTE — Progress Notes (Signed)
 CC: Work clearance  HPI:  Ms.Penny Tanner is a 38 y.o. female living with a history stated below and presents today for work clearance. Please see problem based assessment and plan for additional details.  Past Medical History:  Diagnosis Date   DM (diabetes mellitus), type 2 (HCC)    Hyperthyroidism    PCOS (polycystic ovarian syndrome)     Current Outpatient Medications on File Prior to Visit  Medication Sig Dispense Refill   blood glucose meter kit and supplies Dispense based on patient and insurance preference. Use up to four times daily as directed. (FOR ICD-10 E10.9, E11.9). 1 each 0   HYDROcodone -acetaminophen  (NORCO/VICODIN) 5-325 MG tablet Take 1 tablet by mouth every 4 (four) hours as needed. 10 tablet 0   lidocaine  (XYLOCAINE ) 2 % solution Use as directed 15 mLs in the mouth or throat as needed for mouth pain. 300 mL 0   metFORMIN  (GLUCOPHAGE ) 1000 MG tablet Take 1 tablet (1,000 mg total) by mouth 2 (two) times daily with a meal. 60 tablet 2   methimazole  (TAPAZOLE ) 10 MG tablet Take 2 tablets (20 mg total) by mouth 2 (two) times daily. 60 tablet 2   ondansetron  (ZOFRAN -ODT) 8 MG disintegrating tablet Take 1 tablet (8 mg total) by mouth every 8 (eight) hours as needed for nausea or vomiting. 20 tablet 0   pantoprazole  (PROTONIX ) 20 MG tablet Take 1 tablet (20 mg total) by mouth daily. 30 tablet 1   propranolol  (INDERAL ) 60 MG tablet Take 1 tablet (60 mg total) by mouth every 4 (four) hours. 60 tablet 3   No current facility-administered medications on file prior to visit.    Family History  Problem Relation Age of Onset   Thyroid  disease Neg Hx     Social History   Socioeconomic History   Marital status: Single    Spouse name: Not on file   Number of children: Not on file   Years of education: Not on file   Highest education level: Some college, no degree  Occupational History   Not on file  Tobacco Use   Smoking status: Never   Smokeless tobacco: Never   Substance and Sexual Activity   Alcohol use: Never   Drug use: Never   Sexual activity: Not on file  Other Topics Concern   Not on file  Social History Narrative   Not on file   Social Drivers of Health   Financial Resource Strain: Medium Risk (06/12/2024)   Overall Financial Resource Strain (CARDIA)    Difficulty of Paying Living Expenses: Somewhat hard  Food Insecurity: No Food Insecurity (06/12/2024)   Hunger Vital Sign    Worried About Running Out of Food in the Last Year: Never true    Ran Out of Food in the Last Year: Never true  Transportation Needs: No Transportation Needs (06/12/2024)   PRAPARE - Administrator, Civil Service (Medical): No    Lack of Transportation (Non-Medical): No  Physical Activity: Sufficiently Active (06/12/2024)   Exercise Vital Sign    Days of Exercise per Week: 5 days    Minutes of Exercise per Session: 30 min  Stress: No Stress Concern Present (06/12/2024)   [employer] of Occupational Health - Occupational Stress Questionnaire    Feeling of Stress: Not at all  Social Connections: Socially Isolated (06/12/2024)   Social Connection and Isolation Panel    Frequency of Communication with Friends and Family: Twice a week    Frequency of Social  Gatherings with Friends and Family: Once a week    Attends Religious Services: Never    Database administrator or Organizations: No    Attends Engineer, structural: Not on file    Marital Status: Never married  Intimate Partner Violence: Not on file    Review of Systems: ROS negative except for what is noted on the assessment and plan.  Vitals:   06/13/24 1016  BP: 118/69  Pulse: 91  Temp: 98 F (36.7 C)  TempSrc: Oral  SpO2: 100%  Weight: 258 lb (117 kg)  Height: 5' 9 (1.753 m)    Physical Exam: Constitutional: well-appearing female  in no acute distress Cardiovascular: regular rate and rhythm, no m/r/g Pulmonary/Chest: normal work of breathing on room air, lungs  clear to auscultation bilaterally Abdominal: soft, non-tender, non-distended MSK: normal bulk and tone Psych: normal mood and affect  Assessment & Plan:   UTI symptoms Pt presents for an acute visit for work clearance. 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 She does have increase in urine frequency, but does not have any dysuria. She does not have any systemic symptoms such as fevers, or chills, no flank pain either.   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 I have provided her with a letter stating that.   Plan:  - UA reflex to culture  Pt does have multiple chronic co morbidities that need addressing, as this was an acute visit. I have scheduled her for a follow up within a month to discuss this with a provider.   Patient discussed with Dr. Machen  Sabatino Williard, M.D. [facility], PGY-3 Pager: [phone] Date 06/13/2024 Time 1:23 PM

## 2024-06-13 NOTE — Assessment & Plan Note (Addendum)
 Pt presents for an acute visit for work clearance. 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 She does have increase in urine frequency, but does not have any dysuria. She does not have any systemic symptoms such as fevers, or chills, no flank pain either.   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 I have provided her with a letter stating that.   Plan:  - UA reflex to culture

## 2024-06-14 LAB — URINALYSIS, ROUTINE W REFLEX MICROSCOPIC
Bilirubin, UA: NEGATIVE
Glucose, UA: NEGATIVE
Ketones, UA: NEGATIVE
Leukocytes,UA: NEGATIVE
Nitrite, UA: NEGATIVE
Protein,UA: NEGATIVE
Specific Gravity, UA: 1.011 (ref 1.005–1.030)
Urobilinogen, Ur: 0.2 mg/dL (ref 0.2–1.0)
pH, UA: 7.5 (ref 5.0–7.5)

## 2024-06-14 LAB — MICROSCOPIC EXAMINATION
Bacteria, UA: NONE SEEN
Casts: NONE SEEN /LPF

## 2024-06-14 NOTE — Progress Notes (Signed)
 Internal Medicine Clinic Attending  Case discussed with the resident at the time of the visit.  We reviewed the resident's history and exam and pertinent patient test results.  I agree with the assessment, diagnosis, and plan of care documented in the resident's note.   U/A not indicative of a UTI.  Please repeat U/A at 52-month follow up appointment to evaluate for asymptomatic microhematuria in this low-risk patient.

## 2024-06-15 ENCOUNTER — Ambulatory Visit: Payer: Self-pay | Admitting: Student

## 2024-06-15 LAB — URINE CULTURE

## 2024-07-11 ENCOUNTER — Encounter

## 2024-07-11 NOTE — Progress Notes (Deleted)
 CC: Follow-up  HPI:  Ms.Penny Tanner is a 38 y.o. female living with a history stated below and presents today for follow-up. Please see problem based assessment and plan for additional details.  Past Medical History:  Diagnosis Date   DM (diabetes mellitus), type 2 (HCC)    Hyperthyroidism    PCOS (polycystic ovarian syndrome)     Current Outpatient Medications on File Prior to Visit  Medication Sig Dispense Refill   blood glucose meter kit and supplies Dispense based on patient and insurance preference. Use up to four times daily as directed. (FOR ICD-10 E10.9, E11.9). 1 each 0   HYDROcodone -acetaminophen  (NORCO/VICODIN) 5-325 MG tablet Take 1 tablet by mouth every 4 (four) hours as needed. 10 tablet 0   lidocaine  (XYLOCAINE ) 2 % solution Use as directed 15 mLs in the mouth or throat as needed for mouth pain. 300 mL 0   metFORMIN  (GLUCOPHAGE ) 1000 MG tablet Take 1 tablet (1,000 mg total) by mouth 2 (two) times daily with a meal. 60 tablet 2   methimazole  (TAPAZOLE ) 10 MG tablet Take 2 tablets (20 mg total) by mouth 2 (two) times daily. 60 tablet 2   ondansetron  (ZOFRAN -ODT) 8 MG disintegrating tablet Take 1 tablet (8 mg total) by mouth every 8 (eight) hours as needed for nausea or vomiting. 20 tablet 0   pantoprazole  (PROTONIX ) 20 MG tablet Take 1 tablet (20 mg total) by mouth daily. 30 tablet 1   propranolol  (INDERAL ) 60 MG tablet Take 1 tablet (60 mg total) by mouth every 4 (four) hours. 60 tablet 3   No current facility-administered medications on file prior to visit.    Family History  Problem Relation Age of Onset   Thyroid  disease Neg Hx     Social History   Socioeconomic History   Marital status: Single    Spouse name: Not on file   Number of children: Not on file   Years of education: Not on file   Highest education level: Some college, no degree  Occupational History   Not on file  Tobacco Use   Smoking status: Never   Smokeless tobacco: Never  Substance  and Sexual Activity   Alcohol use: Never   Drug use: Never   Sexual activity: Not on file  Other Topics Concern   Not on file  Social History Narrative   Not on file   Social Drivers of Health   Financial Resource Strain: Medium Risk (06/12/2024)   Overall Financial Resource Strain (CARDIA)    Difficulty of Paying Living Expenses: Somewhat hard  Food Insecurity: No Food Insecurity (06/12/2024)   Hunger Vital Sign    Worried About Running Out of Food in the Last Year: Never true    Ran Out of Food in the Last Year: Never true  Transportation Needs: No Transportation Needs (06/12/2024)   PRAPARE - Administrator, Civil Service (Medical): No    Lack of Transportation (Non-Medical): No  Physical Activity: Sufficiently Active (06/12/2024)   Exercise Vital Sign    Days of Exercise per Week: 5 days    Minutes of Exercise per Session: 30 min  Stress: No Stress Concern Present (06/12/2024)   [employer] of Occupational Health - Occupational Stress Questionnaire    Feeling of Stress: Not at all  Social Connections: Socially Isolated (06/12/2024)   Social Connection and Isolation Panel    Frequency of Communication with Friends and Family: Twice a week    Frequency of Social Gatherings with  Friends and Family: Once a week    Attends Religious Services: Never    Database administrator or Organizations: No    Attends Engineer, structural: Not on file    Marital Status: Never married  Intimate Partner Violence: Not on file    Review of Systems: ROS   There were no vitals filed for this visit.  Physical Exam: Physical Exam   Assessment & Plan:     Patient {[id number]::discussed with,seen with} Dr. {[id number]::Tpoopjfd,Z. Hoffman,Mullen,Narendra,Vincent,Guilloud,Lau,Machen}  Assessment & Plan    No orders of the defined types were placed in this encounter.    Rebecka Pion, D.O. [facility] Internal Medicine, PGY-1 Date  07/11/2024 Time 10:18 AM

## 2024-08-22 ENCOUNTER — Emergency Department (HOSPITAL_BASED_OUTPATIENT_CLINIC_OR_DEPARTMENT_OTHER)
Admission: EM | Admit: 2024-08-22 | Discharge: 2024-08-22 | Disposition: A | Discharge status: Home / Self Care | Payer: Worker's Compensation | Attending: Emergency Medicine | Admitting: Emergency Medicine

## 2024-08-22 ENCOUNTER — Emergency Department (HOSPITAL_BASED_OUTPATIENT_CLINIC_OR_DEPARTMENT_OTHER): Payer: Worker's Compensation | Admitting: Radiology

## 2024-08-22 ENCOUNTER — Other Ambulatory Visit: Payer: Self-pay

## 2024-08-22 DIAGNOSIS — Y990 Civilian activity done for income or pay: Secondary | ICD-10-CM | POA: Insufficient documentation

## 2024-08-22 DIAGNOSIS — S6992XA Unspecified injury of left wrist, hand and finger(s), initial encounter: Secondary | ICD-10-CM | POA: Diagnosis present

## 2024-08-22 DIAGNOSIS — W228XXA Striking against or struck by other objects, initial encounter: Secondary | ICD-10-CM | POA: Insufficient documentation

## 2024-08-22 MED ORDER — NAPROXEN 500 MG PO TABS: 500.0000 mg | ORAL_TABLET | Freq: Two times a day (BID) | ORAL | 0 refills | Status: AC

## 2024-08-22 MED ORDER — NAPROXEN 250 MG PO TABS
500.0000 mg | ORAL_TABLET | Freq: Once | ORAL | Status: AC
  Administered 2024-08-22: 500 mg via ORAL
  Filled 2024-08-22: qty 2

## 2024-08-22 NOTE — ED Triage Notes (Signed)
 Patient states she was trying to tether a wheelchair down at work and it snapped back and hit her hand. Pain to left hand, wrist, and elbow.

## 2024-08-22 NOTE — ED Notes (Signed)
 Refused gown

## 2024-08-22 NOTE — ED Provider Notes (Signed)
 [place] EM[facility] DEPARTMENT AT [facility] Provider Note   CSN: [id number] Arrival date & time: 08/22/24  1445     Patient presents with: Arm Injury   Penny Tanner is a 38 y.o. female.   38 open with a past medical history presents to the ED status post injury while at work.  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  She endorses pain along the dorsum aspect, palmar aspect  The history is provided by the patient.  Arm Injury Associated symptoms: no fever        Prior to Admission medications   Medication Sig Start Date End Date Taking? Authorizing Provider  naproxen (NAPROSYN) 500 MG tablet Take 1 tablet (500 mg total) by mouth 2 (two) times daily for 7 days. 08/22/24 08/29/24 Yes Jeancarlo Leffler, PA-C  blood glucose meter kit and supplies Dispense based on patient and insurance preference. Use up to four times daily as directed. (FOR ICD-10 E10.9, E11.9). 02/07/22   Lou Claretta HERO, MD  HYDROcodone -acetaminophen  (NORCO/VICODIN) 5-325 MG tablet Take 1 tablet by mouth every 4 (four) hours as needed. 04/19/24   Beverley Leita LABOR, PA-C  lidocaine  (XYLOCAINE ) 2 % solution Use as directed 15 mLs in the mouth or throat as needed for mouth pain. 05/16/22   Eudelia Maude SAUNDERS, PA-C  metFORMIN  (GLUCOPHAGE ) 1000 MG tablet Take 1 tablet (1,000 mg total) by mouth 2 (two) times daily with a meal. 02/12/22 05/13/22  Gawaluck, Greylon, MD  methimazole  (TAPAZOLE ) 10 MG tablet Take 2 tablets (20 mg total) by mouth 2 (two) times daily. 02/07/22   Lou Claretta HERO, MD  ondansetron  (ZOFRAN -ODT) 8 MG disintegrating tablet Take 1 tablet (8 mg total) by mouth every 8 (eight) hours as needed for nausea or vomiting. 10/12/22   Emelia Sluder, PA-C  pantoprazole  (PROTONIX ) 20 MG tablet Take 1 tablet (20 mg total) by mouth daily. 10/12/22   Emelia Sluder, PA-C  propranolol  (INDERAL ) 60 MG  tablet Take 1 tablet (60 mg total) by mouth every 4 (four) hours. 02/07/22   Amponsah, Prosper M, MD    Allergies: Patient has no known allergies.    Review of Systems  Constitutional:  Negative for fever.  Musculoskeletal:  Positive for arthralgias.    Updated Vital Signs BP (!) 144/87   Pulse 97   Temp 98.1 F (36.7 C) (Oral)   Resp 16   SpO2 98%   Physical Exam Vitals and nursing note reviewed.  Constitutional:      Appearance: Normal appearance.  HENT:     Head: Normocephalic.     Mouth/Throat:     Mouth: Mucous membranes are moist.  Cardiovascular:     Rate and Rhythm: Normal rate.  Pulmonary:     Effort: Pulmonary effort is normal.  Abdominal:     General: Abdomen is flat.  Musculoskeletal:        General: Tenderness present.     Right hand: Tenderness present. No deformity, lacerations or bony tenderness. Decreased range of motion. Normal sensation. There is no disruption of two-point discrimination. Normal capillary refill. Normal pulse.     Cervical back: Normal range of motion and neck supple.     Comments: Blister pulses, pain with hand flexion and extension.  No visible wound, no edema noted, no obvious deformity.  Skin:    General: Skin is warm and dry.  Neurological:  Mental Status: She is alert and oriented to person, place, and time.     (all labs ordered are listed, but only abnormal results are displayed) Labs Reviewed - No data to display  EKG: None  Radiology: DG Elbow Complete Left Result Date: 08/22/2024 CLINICAL DATA:  Left elbow injury with pain. EXAM: LEFT ELBOW - COMPLETE 3+ VIEW COMPARISON:  None Available. FINDINGS: There is no evidence of fracture, dislocation, or joint effusion. There is no evidence of arthropathy or other focal bone abnormality. Soft tissues are unremarkable. IMPRESSION: Negative. Electronically Signed   By: Toribio Agreste M.D.   On: 08/22/2024 15:35   DG Hand Complete Left Result Date: 08/22/2024 CLINICAL DATA:   Left hand injury. EXAM: LEFT HAND - COMPLETE 3+ VIEW COMPARISON:  None Available. FINDINGS: There is no evidence of fracture or dislocation. There is no evidence of arthropathy or other focal bone abnormality. Soft tissues are unremarkable. IMPRESSION: Negative. Electronically Signed   By: Toribio Agreste M.D.   On: 08/22/2024 15:35     Procedures   Medications Ordered in the ED  naproxen (NAPROSYN) tablet 500 mg (has no administration in time range)                                    Medical Decision Making Amount and/or Complexity of Data Reviewed Radiology: ordered.  Risk Prescription drug management.  Present to the ED status post left hand injury while at work.  Patient reports she got struck with a metal buckle, endorsing pain along the palmar aspect of her hand exacerbated with any type of movement.  No edema, no obvious deformity noted.  She is neurovascularly intact.  X-rays of her left hand, left elbow obtained did not show any acute fracture or dislocation. Results were discussed with patient at length, she was given naproxen while in the emergency department.  We discussed RICE therapy, naproxen to help with inflammation at home.  Will return to the ED if she worsens.  No precautions seen as she does have some pain along the base of the left thumb, will place her on a splint to avoid any scaphoid missed fracture.  Patient is hemodynamically stable for discharge.    Portions of this note were generated with Scientist, clinical (histocompatibility and immunogenetics). Dictation errors may occur despite best attempts at proofreading.  Final diagnoses:  Injury of left wrist, initial encounter    ED Discharge Orders          Ordered    naproxen (NAPROSYN) 500 MG tablet  2 times daily        08/22/24 1601               Vester Titsworth, PA-C 08/22/24 1618    Mannie Pac T, DO 08/23/24 1626

## 2024-08-22 NOTE — Discharge Instructions (Signed)
 You were given a short prescription for anti inflammatories to help with your symptoms. You may also apply ice, heat to help with your symptoms.

## 2024-10-13 ENCOUNTER — Telehealth: Payer: Self-pay | Admitting: *Deleted

## 2024-10-13 NOTE — Telephone Encounter (Signed)
 Pt overdue for visit Attempted to contact pt with an appt No answer, message left on recorder No further action needed at this time   Will await call back from pt

## 2024-11-10 ENCOUNTER — Other Ambulatory Visit: Payer: Self-pay

## 2024-11-10 ENCOUNTER — Emergency Department (HOSPITAL_BASED_OUTPATIENT_CLINIC_OR_DEPARTMENT_OTHER)
Admission: EM | Admit: 2024-11-10 | Discharge: 2024-11-10 | Disposition: A | Discharge status: Home / Self Care | Payer: Self-pay | Attending: Emergency Medicine | Admitting: Emergency Medicine

## 2024-11-10 DIAGNOSIS — R059 Cough, unspecified: Secondary | ICD-10-CM | POA: Insufficient documentation

## 2024-11-10 DIAGNOSIS — R1010 Upper abdominal pain, unspecified: Secondary | ICD-10-CM | POA: Insufficient documentation

## 2024-11-10 DIAGNOSIS — R5383 Other fatigue: Secondary | ICD-10-CM | POA: Insufficient documentation

## 2024-11-10 DIAGNOSIS — Z7984 Long term (current) use of oral hypoglycemic drugs: Secondary | ICD-10-CM | POA: Insufficient documentation

## 2024-11-10 DIAGNOSIS — E119 Type 2 diabetes mellitus without complications: Secondary | ICD-10-CM | POA: Insufficient documentation

## 2024-11-10 DIAGNOSIS — R112 Nausea with vomiting, unspecified: Secondary | ICD-10-CM | POA: Insufficient documentation

## 2024-11-10 DIAGNOSIS — R197 Diarrhea, unspecified: Secondary | ICD-10-CM | POA: Insufficient documentation

## 2024-11-10 LAB — COMPREHENSIVE METABOLIC PANEL WITH GFR
ALT: 10 U/L (ref 0–44)
AST: 16 U/L (ref 15–41)
Albumin: 3.8 g/dL (ref 3.5–5.0)
Alkaline Phosphatase: 80 U/L (ref 38–126)
Anion gap: 11 (ref 5–15)
BUN: 7 mg/dL (ref 6–20)
CO2: 23 mmol/L (ref 22–32)
Calcium: 8.9 mg/dL (ref 8.9–10.3)
Chloride: 103 mmol/L (ref 98–111)
Creatinine, Ser: 0.63 mg/dL (ref 0.44–1.00)
GFR, Estimated: >60 mL/min
Glucose, Bld: 162 mg/dL — ABNORMAL HIGH (ref 70–99)
Potassium: 3.6 mmol/L (ref 3.5–5.1)
Sodium: 137 mmol/L (ref 135–145)
Total Bilirubin: 0.4 mg/dL (ref 0.0–1.2)
Total Protein: 7.3 g/dL (ref 6.5–8.1)

## 2024-11-10 LAB — RESP PANEL BY RT-PCR (RSV, FLU A&B, COVID)  RVPGX2
Influenza A by PCR: NEGATIVE
Influenza B by PCR: NEGATIVE
Resp Syncytial Virus by PCR: NEGATIVE
SARS Coronavirus 2 by RT PCR: NEGATIVE

## 2024-11-10 LAB — CBC
HCT: 37.2 % (ref 36.0–46.0)
Hemoglobin: 11.6 g/dL — ABNORMAL LOW (ref 12.0–15.0)
MCH: 26 pg (ref 26.0–34.0)
MCHC: 31.2 g/dL (ref 30.0–36.0)
MCV: 83.4 fL (ref 80.0–100.0)
Platelets: 315 K/uL (ref 150–400)
RBC: 4.46 MIL/uL (ref 3.87–5.11)
RDW: 14 % (ref 11.5–15.5)
WBC: 6.2 K/uL (ref 4.0–10.5)
nRBC: 0 % (ref 0.0–0.2)

## 2024-11-10 LAB — LIPASE, BLOOD: Lipase: 33 U/L (ref 11–51)

## 2024-11-10 MED ORDER — ONDANSETRON 4 MG PO TBDP: 4.0000 mg | ORAL_TABLET | Freq: Three times a day (TID) | ORAL | 0 refills | Status: AC | PRN

## 2024-11-10 MED ORDER — ONDANSETRON HCL 4 MG/2ML IJ SOLN
4.0000 mg | Freq: Once | INTRAMUSCULAR | Status: AC
  Administered 2024-11-10: 4 mg via INTRAVENOUS
  Filled 2024-11-10: qty 2

## 2024-11-10 MED ORDER — SODIUM CHLORIDE 0.9 % IV BOLUS
1000.0000 mL | Freq: Once | INTRAVENOUS | Status: AC
  Administered 2024-11-10: 1000 mL via INTRAVENOUS

## 2024-11-10 NOTE — ED Provider Notes (Signed)
 " [place] EMERGENCY DEPARTMENT AT [facility] Provider Note   CSN: [id number] Arrival date & time: 11/10/24  0725     Patient presents with: Emesis   Penny Tanner is a 38 y.o. female.    Emesis Patient presents with nausea vomiting diarrhea.  Has had since Wednesday with today being Friday.  No fevers.  Thinks she may have gotten it at work since he did not have soap and someone was sick there.  Has been fatigued.  Does have abdominal pain.  No fevers.  No blood in the emesis or diarrhea. Denies pregnancy.  Also has had URI symptoms with cough.     Prior to Admission medications  Medication Sig Start Date End Date Taking? Authorizing Provider  blood glucose meter kit and supplies Dispense based on patient and insurance preference. Use up to four times daily as directed. (FOR ICD-10 E10.9, E11.9). 02/07/22   Lou Claretta HERO, MD  HYDROcodone -acetaminophen  (NORCO/VICODIN) 5-325 MG tablet Take 1 tablet by mouth every 4 (four) hours as needed. 04/19/24   Beverley Leita LABOR, PA-C  lidocaine  (XYLOCAINE ) 2 % solution Use as directed 15 mLs in the mouth or throat as needed for mouth pain. 05/16/22   Eudelia Maude SAUNDERS, PA-C  metFORMIN  (GLUCOPHAGE ) 1000 MG tablet Take 1 tablet (1,000 mg total) by mouth 2 (two) times daily with a meal. 02/12/22 05/13/22  Gawaluck, Greylon, MD  methimazole  (TAPAZOLE ) 10 MG tablet Take 2 tablets (20 mg total) by mouth 2 (two) times daily. 02/07/22   Lou Claretta HERO, MD  ondansetron  (ZOFRAN -ODT) 4 MG disintegrating tablet Take 1 tablet (4 mg total) by mouth every 8 (eight) hours as needed for nausea or vomiting. 11/10/24   Patsey Lot, MD  pantoprazole  (PROTONIX ) 20 MG tablet Take 1 tablet (20 mg total) by mouth daily. 10/12/22   Emelia Sluder, PA-C  propranolol  (INDERAL ) 60 MG tablet Take 1 tablet (60 mg total) by mouth every 4 (four) hours. 02/07/22   Amponsah, Prosper M, MD    Allergies: Patient has no known allergies.    Review of Systems   Gastrointestinal:  Positive for vomiting.    Updated Vital Signs BP 122/82   Pulse 95   Temp 98.1 F (36.7 C) (Oral)   Resp 18   SpO2 99%   Physical Exam Vitals and nursing note reviewed.  Cardiovascular:     Rate and Rhythm: Regular rhythm.  Pulmonary:     Breath sounds: No wheezing or rhonchi.  Abdominal:     Tenderness: There is abdominal tenderness.     Comments: Upper abdominal tenderness no rebound or guarding.  Neurological:     Mental Status: She is alert.     (all labs ordered are listed, but only abnormal results are displayed) Labs Reviewed  CBC - Abnormal; Notable for the following components:      Result Value   Hemoglobin 11.6 (*)    All other components within normal limits  COMPREHENSIVE METABOLIC PANEL WITH GFR - Abnormal; Notable for the following components:   Glucose, Bld 162 (*)    All other components within normal limits  RESP PANEL BY RT-PCR (RSV, FLU A&B, COVID)  RVPGX2  LIPASE, BLOOD    EKG: None  Radiology: No results found.   Procedures   Medications Ordered in the ED  ondansetron  (ZOFRAN ) injection 4 mg (4 mg Intravenous Given 11/10/24 0749)  sodium chloride  0.9 % bolus 1,000 mL (1,000 mLs Intravenous New Bag/Given 11/10/24 0748)  Medical Decision Making Amount and/or Complexity of Data Reviewed Labs: ordered.  Risk Prescription drug management.   Patient with URI symptoms cough nausea vomiting diarrhea.  Likely viral infection.  Lungs clear.  Do not see pneumonia.  Will get basic blood work.  With her diabetes to rule out causes such as severe dehydration.  Will give fluid bolus and antiemetics.  Blood work overall reassuring.  Sugar just mildly elevated.  Feeling better after IV fluids and antiemetics.  Has tolerated some liquids.  Will discharge home.      Final diagnoses:  Nausea vomiting and diarrhea    ED Discharge Orders          Ordered    ondansetron  (ZOFRAN -ODT) 4 MG  disintegrating tablet  Every 8 hours PRN        11/10/24 0944               Patsey Lot, MD 11/10/24 [phone]  "

## 2024-11-10 NOTE — ED Triage Notes (Signed)
 Reports n/v/d since Wednesday. Denies fevers.
